# Patient Record
Sex: Female | Born: 1970 | Race: Black or African American | Hispanic: No | Marital: Married | Smoking: Never smoker
Health system: Southern US, Community
[De-identification: ages and names within clinical notes are randomized; demographics above are authoritative.]

## PROBLEM LIST (undated history)

## (undated) DIAGNOSIS — R87619 Unspecified abnormal cytological findings in specimens from cervix uteri: Secondary | ICD-10-CM

## (undated) DIAGNOSIS — Z9889 Other specified postprocedural states: Secondary | ICD-10-CM

## (undated) DIAGNOSIS — D649 Anemia, unspecified: Secondary | ICD-10-CM

## (undated) DIAGNOSIS — K219 Gastro-esophageal reflux disease without esophagitis: Secondary | ICD-10-CM

## (undated) DIAGNOSIS — F419 Anxiety disorder, unspecified: Secondary | ICD-10-CM

## (undated) DIAGNOSIS — R112 Nausea with vomiting, unspecified: Secondary | ICD-10-CM

## (undated) DIAGNOSIS — IMO0002 Reserved for concepts with insufficient information to code with codable children: Secondary | ICD-10-CM

## (undated) HISTORY — DX: Reserved for concepts with insufficient information to code with codable children: IMO0002

## (undated) HISTORY — DX: Unspecified abnormal cytological findings in specimens from cervix uteri: R87.619

## (undated) HISTORY — PX: SALIVARY GLAND SURGERY: SHX768

---

## 1998-05-18 ENCOUNTER — Other Ambulatory Visit: Admission: RE | Admit: 1998-05-18 | Discharge: 1998-05-18 | Payer: Self-pay | Admitting: Obstetrics

## 1998-05-18 ENCOUNTER — Ambulatory Visit: Admission: RE | Admit: 1998-05-18 | Discharge: 1998-05-18 | Payer: Self-pay | Admitting: Obstetrics

## 1998-09-20 ENCOUNTER — Ambulatory Visit (HOSPITAL_COMMUNITY): Admission: RE | Admit: 1998-09-20 | Discharge: 1998-09-20 | Payer: Self-pay | Admitting: Obstetrics

## 1999-01-10 ENCOUNTER — Inpatient Hospital Stay (HOSPITAL_COMMUNITY): Admission: AD | Admit: 1999-01-10 | Discharge: 1999-01-12 | Payer: Self-pay | Admitting: Obstetrics

## 2000-02-22 ENCOUNTER — Other Ambulatory Visit: Admission: RE | Admit: 2000-02-22 | Discharge: 2000-02-22 | Payer: Self-pay | Admitting: Otolaryngology

## 2000-04-03 ENCOUNTER — Encounter (INDEPENDENT_AMBULATORY_CARE_PROVIDER_SITE_OTHER): Payer: Self-pay | Admitting: *Deleted

## 2000-04-03 ENCOUNTER — Ambulatory Visit (HOSPITAL_BASED_OUTPATIENT_CLINIC_OR_DEPARTMENT_OTHER): Admission: RE | Admit: 2000-04-03 | Discharge: 2000-04-04 | Payer: Self-pay | Admitting: Otolaryngology

## 2001-06-17 ENCOUNTER — Other Ambulatory Visit: Admission: RE | Admit: 2001-06-17 | Discharge: 2001-06-17 | Payer: Self-pay | Admitting: *Deleted

## 2003-07-30 ENCOUNTER — Encounter: Payer: Self-pay | Admitting: Family Medicine

## 2003-07-30 ENCOUNTER — Encounter: Admission: RE | Admit: 2003-07-30 | Discharge: 2003-07-30 | Payer: Self-pay | Admitting: Family Medicine

## 2003-09-09 ENCOUNTER — Other Ambulatory Visit: Admission: RE | Admit: 2003-09-09 | Discharge: 2003-09-09 | Payer: Self-pay | Admitting: Family Medicine

## 2004-11-20 ENCOUNTER — Other Ambulatory Visit: Admission: RE | Admit: 2004-11-20 | Discharge: 2004-11-20 | Payer: Self-pay | Admitting: Family Medicine

## 2005-12-12 ENCOUNTER — Other Ambulatory Visit: Admission: RE | Admit: 2005-12-12 | Discharge: 2005-12-12 | Payer: Self-pay | Admitting: Obstetrics and Gynecology

## 2007-09-26 ENCOUNTER — Encounter: Admission: RE | Admit: 2007-09-26 | Discharge: 2007-09-26 | Payer: Self-pay | Admitting: Family Medicine

## 2008-07-15 ENCOUNTER — Other Ambulatory Visit: Admission: RE | Admit: 2008-07-15 | Discharge: 2008-07-15 | Payer: Self-pay | Admitting: Obstetrics and Gynecology

## 2008-07-29 ENCOUNTER — Other Ambulatory Visit: Admission: RE | Admit: 2008-07-29 | Discharge: 2008-07-29 | Payer: Self-pay | Admitting: Obstetrics and Gynecology

## 2010-06-06 ENCOUNTER — Encounter: Admission: RE | Admit: 2010-06-06 | Discharge: 2010-06-06 | Payer: Self-pay | Admitting: Obstetrics and Gynecology

## 2011-03-06 NOTE — H&P (Signed)
NAME:  Judy Bishop, FISHBURN NO.:  [id number]   MEDICAL RECORD NO.:  [id number]          PATIENT TYPE:  AMB   LOCATION:  SDC                           FACILITY:  [facility]   PHYSICIAN:  Charles A. Delcambre, MDDATE OF BIRTH:  December 30, 1970   DATE OF ADMISSION:  DATE OF DISCHARGE:                              HISTORY & PHYSICAL   This patient to be admitted to undergo laparoscopic tubal ligation and  removal of IUD for undesired fertility.  She is 40 year old gravida 3,  para 3-0-0-3 who has been counseled adequately.  She is adamantly asking  for tubal ligation as well as her husband  getting the vasectomy.  She  states her husband is to have his vasectomy on July 30, 2008.  She has  an IUD in place that she wants to have removed secondary to problems,  bleeding, and she understands risks of surgery to include infection,  bleeding, bowel, and bladder damage, ureteral damage, blood product risk  including hepatitis and HIV exposure, failure rate approximately 1 in  400 as it is with the vasectomy including the combination or failure  rate about 1 in 160,000 roughly on my calculation.  She underwent  colposcopy today for low-grade Pap smear, unknown HPV.  HPV testing was  done.  At colposcopy, 2 biopsies were done with nonstained Lugol, and we  will get the biopsies back 1 or 2 days before the surgery, which will  indicate whether we will need to proceed with expected management.  Repeat Pap in 6 months or LEEP therapy while she is under anesthesia.   PAST MEDICAL HISTORY.:  None.   SURGICAL HISTORY:  Cesarean section for breech, SVT x2, thereafter.   MEDICATIONS:  None.   ALLERGIES:  No known drug allergies.   SOCIAL HISTORY:  No tobacco, ethanol, or drug use or STD exposure in the  past that she is aware of.  She is married in monogamous relationship  with her husband.   FAMILY HISTORY:  Denies family history of breast, uterus, ovaries  cervix, colon cancer, lymphoma,  coronary artery disease, stroke,  diabetes, or hypertension.   REVIEW OF SYSTEMS:  Denies fever, chills, nausea, vomiting, diarrhea,  constipation, bowel, bladder changes, headache, dizziness, chest pain,  shortness of breath, wheezing, urgency, frequency, dysuria,  galactorrhea, or emotional changes.   PHYSICAL EXAMINATION:  GENERAL:  Alert and oriented.  VITAL SIGNS:  Blood pressure 110/74, height 5 feet 4 inches, weight 164  pounds, respirations 18, and pulse 76.  HEENT:  Grossly within normal limits.  HEART:  Regular rate and rhythm without murmur, rub, or gallop.  LUNGS:  Clear bilaterally.  ABDOMEN:  Soft, nontender.  No hepatosplenomegaly.  PELVIC:  Normal external female genitalia.  Bartholin, urethra, and  Skene glands are normal.  Vulva without discharge or lesions.  Multiparous cervix seen.  IUD strings are seen.  BIMANUAL:  Uterus not enlarged.  Ovaries are palpably normal size  bilaterally.   ASSESSMENT:  1. Undesired fertility.  2. Undergoing workup for low grade Pap smear.  3. Intrauterine device removed, desire removal.  PLAN:  Laparoscopic bilateral tubal ligation, planned.  Fallope rings  and removal of the IUD after successful sterilization.  Preoperative CBC  and serum hCG.  All questions were answered and she gave informed  consent.  We will proceed as outlined.  N.p.o. past midnightthe day  before surgery.      Charles A. Sydnee Cabal, MD  Electronically Signed     CAD/MEDQ  D:  07/29/2008  T:  07/30/2008  Job:  [id number]

## 2011-03-09 NOTE — Op Note (Signed)
[place]  Patient:    Judy Bishop, Judy Bishop                           MRN: [id number] Proc. Date: 04/03/00 Adm. Date:  [id number] Disc. Date: [id number] Attending:  Susy Frizzle CC:         Gretta Arab. Valentina Lucks, M.D.                           Operative Report  PREOPERATIVE DIAGNOSIS:  Submandibular mass.  POSTOPERATIVE DIAGNOSIS:  Submandibular mass.  OPERATION:  Submandibular triangle dissection.  REFERRING PHYSICIAN:  Gretta Arab. Valentina Lucks, M.D.  SURGEON:  Jefry H. Pollyann Kennedy, M.D.  ASSISTANT:  Kathy Breach, M.D.  ANESTHESIA:  General endotracheal anesthesia.  COMPLICATIONS:  None.  FINDINGS:  A 2 cm separate mass superior to the submandibular gland in the submandibular triangle medial to the ramus of the mandible.  The specimen was sent for pathologic evaluation and labeled submandibular triangle content.  Additional findings were two enlarged lymph nodes in the submandibular triangle-one preglandular and one prevascular lymph node approximately 1 cm in diameter.  The patient tolerated the procedure well.  She was awakened, extubated, and transferred to the recovery room in stable condition.  INDICATIONS:  A 40 year old with a several month history of a firm mass in the submandibular area.  Fine needle aspiration biopsy was suspicious for lymphoproliferative process.  Risks, benefits, alternatives, complications of the procedure were explained, who seemed to understand, and agreed to the surgery.  DESCRIPTION OF PROCEDURE:  The patient was taken to the operating room and placed on the operating table in the supine position.  Following the induction of general endotracheal anesthesia, the table was turned 90 degrees.  A shoulder roll was placed and the neck was prepped and draped in a standard fashion.  A transverse incision in the upper cervical crease was outlined about two fingerbreadths below the angle of the mandible and was incised  with electrocautery.  The platysma was divided and subplatysmal flap was developed superiorly up to the ramus of the mandible.  The marginal mandibular branch of the facial nerve was identified and retracted superiorly.  The facial nodes were brought inferiorly with the specimen.  The facial vessels were divided with clamps and ligated with 4-0 silk sutures.  The contents of the triangle were brought down off of the anterior belly of the digastric muscle and the mylohyoid muscle.  The mylohyoid was reflected anteriorly and the lingual nerve was identified and preserved.  The ganglion was taken.  The lingual extension of the submandibular gland and the ducts were ligated between clamps and divided.  The hypoglossal nerve was identified and preserved.  The posterior fascial vessels were identified and ligated between clamps with silk sutures.  The mouth was brought down in continuity with the gland, surrounding lymph nodes, and surrounding fiber fatty tissue.  Specimen was brought down off of the digastric and was delivered from the wound and sent fresh for pathologic evaluation and lymphoma workup.  The wound was irrigated with saline and electrocautery and silk sutures were used as needed for hemostasis.  The wound was closed in layers using 3-0 chromic on the platysma layer and a running 5-0 nylon on the skin.  A 7 [place] JP drain was left in the wound, exited through the posterior aspect of the incision, secured in place with nylon suture, and the  patient was awakened, extubated, and transferred to the recovery. DD:  04/03/00 TD:  04/06/00 Job: [id number]

## 2013-08-24 ENCOUNTER — Telehealth: Payer: Self-pay | Admitting: Obstetrics & Gynecology

## 2013-08-24 NOTE — Telephone Encounter (Signed)
Message left to return call to [place] at [phone] at both home and mobile numbers.

## 2013-08-24 NOTE — Telephone Encounter (Signed)
Patient started spotting October 6th and she thinkgs she had her cycle on October 17 to the 21 because the bleeding was heavier but she has continued spotting. Wears panty liner for the spotting.

## 2013-08-24 NOTE — Telephone Encounter (Signed)
Called again before close of office and no answer on cell phone.

## 2013-08-25 NOTE — Telephone Encounter (Signed)
Patient returning Tracy's call. °

## 2013-08-25 NOTE — Telephone Encounter (Signed)
Spoke with patient.  Needs AEX which is scheduled for 08/28/13  Judy Bishop is a 42 y.o. female  Has not been seen in office since 03/07/11 for 6 month follow up pap with Dr. Hyacinth Meeker.  Not on any birth control, husband has had vasectomy. Previous Menses were regular, usually lasting 5 days. Since 10/1 has had spotting which she has only required the use of a panty liner, with a cycle from 17-21, which was normal per patient and then had spotting return. Denies abdominal pain. No change in medications.   She wanted to know if she should be concerned. Advised that this irregular bleeding may be a sign of perimenopause.  Advised can discuss at AEX which I have moved up to 11/7.   Routing to provider for final review. Patient agreeable to disposition. Will close encounter

## 2013-08-27 ENCOUNTER — Encounter: Payer: Self-pay | Admitting: Obstetrics & Gynecology

## 2013-08-28 ENCOUNTER — Ambulatory Visit (INDEPENDENT_AMBULATORY_CARE_PROVIDER_SITE_OTHER): Payer: PRIVATE HEALTH INSURANCE | Admitting: Obstetrics & Gynecology

## 2013-08-28 ENCOUNTER — Encounter: Payer: Self-pay | Admitting: Obstetrics & Gynecology

## 2013-08-28 VITALS — BP 116/70 | HR 64 | Resp 16 | Ht 64.75 in | Wt 161.2 lb

## 2013-08-28 DIAGNOSIS — Z0000 Encounter for general adult medical examination without abnormal findings: Secondary | ICD-10-CM

## 2013-08-28 DIAGNOSIS — Z01419 Encounter for gynecological examination (general) (routine) without abnormal findings: Secondary | ICD-10-CM

## 2013-08-28 DIAGNOSIS — R319 Hematuria, unspecified: Secondary | ICD-10-CM

## 2013-08-28 DIAGNOSIS — N852 Hypertrophy of uterus: Secondary | ICD-10-CM

## 2013-08-28 DIAGNOSIS — Z124 Encounter for screening for malignant neoplasm of cervix: Secondary | ICD-10-CM

## 2013-08-28 LAB — COMPREHENSIVE METABOLIC PANEL
ALT: 10 U/L (ref 0–35)
AST: 17 U/L (ref 0–37)
Alkaline Phosphatase: 49 U/L (ref 39–117)
CO2: 26 mEq/L (ref 19–32)
Creat: 0.85 mg/dL (ref 0.50–1.10)
Potassium: 3.8 mEq/L (ref 3.5–5.3)
Sodium: 138 mEq/L (ref 135–145)
Total Bilirubin: 0.7 mg/dL (ref 0.3–1.2)
Total Protein: 6.8 g/dL (ref 6.0–8.3)

## 2013-08-28 LAB — POCT URINALYSIS DIPSTICK
Bilirubin, UA: NEGATIVE
Glucose, UA: NEGATIVE
Ketones, UA: NEGATIVE
Nitrite, UA: NEGATIVE
Protein, UA: NEGATIVE
Urobilinogen, UA: NEGATIVE
pH, UA: 5

## 2013-08-28 LAB — LIPID PANEL
HDL: 78 mg/dL (ref >39)
LDL Cholesterol: 150 mg/dL — ABNORMAL HIGH (ref 0–99)
Triglycerides: 57 mg/dL (ref <150)
VLDL: 11 mg/dL (ref 0–40)

## 2013-08-28 LAB — HEMOGLOBIN, FINGERSTICK: Hemoglobin, fingerstick: 11 g/dL — ABNORMAL LOW (ref 12.0–16.0)

## 2013-08-28 NOTE — Progress Notes (Signed)
42 y.o. G3P3 MarriedAfrican AmericanF here for annual exam.  Has been 2 1/2 years since she's been here.  PMed Hx, PSurgHx, Fam Hx, SH all updated.  Cycles have been regular.  Bleeding is normally four heavy days.  October was abnormal with spotting mid cycle and then real flow for a couple of days.  Bleeding ended 10/26.  Still spotting now.    Patient's last menstrual period was 08/08/2013.          Sexually active: yes  The current method of family planning is vasectomy.    Exercising: yes  recumbant bike Smoker:  no  Health Maintenance: Pap:  03/07/11 WNL History of abnormal Pap:  yes MMG:  06/06/10 diag and US-screening age 21 Colonoscopy:  none BMD:   none TDaP:  Up to date Screening Labs: today, Hb today: 11.0, Urine today: tr RBC, tr WBC   reports that she has never smoked. She has never used smokeless tobacco. She reports that she does not drink alcohol or use illicit drugs.  Past Medical History  Diagnosis Date  . Abnormal Pap smear     Past Surgical History  Procedure Laterality Date  . Cesarean section  1993  . Salivary gland surgery      lump under chin removed    Current Outpatient Prescriptions  Medication Sig Dispense Refill  . IRON PO Take by mouth as needed.      . Multiple Vitamins-Minerals (MULTIVITAMIN PO) Take by mouth daily.       No current facility-administered medications for this visit.    Family History  Problem Relation Age of Onset  . Diabetes Father   . Alzheimer's disease Paternal Grandmother     ROS:  Pertinent items are noted in HPI.  Otherwise, a comprehensive ROS was negative.  Exam:   BP 116/70  Pulse 64  Resp 16  Ht 5' 4.75" (1.645 m)  Wt 161 lb 3.2 oz (73.12 kg)  BMI 27.02 kg/m2  LMP 08/08/2013  Weight change: stable   Height: 5' 4.75" (164.5 cm)  Ht Readings from Last 3 Encounters:  08/28/13 5' 4.75" (1.645 m)    General appearance: alert, cooperative and appears stated age Head: Normocephalic, without obvious  abnormality, atraumatic Neck: no adenopathy, supple, symmetrical, trachea midline and thyroid normal to inspection and palpation Lungs: clear to auscultation bilaterally Breasts: normal appearance, no masses or tenderness Heart: regular rate and rhythm Abdomen: soft, non-tender; bowel sounds normal; no masses,  no organomegaly Extremities: extremities normal, atraumatic, no cyanosis or edema Skin: Skin color, texture, turgor normal. No rashes or lesions Lymph nodes: Cervical, supraclavicular, and axillary nodes normal. No abnormal inguinal nodes palpated Neurologic: Grossly normal   Pelvic: External genitalia:  no lesions              Urethra:  normal appearing urethra with no masses, tenderness or lesions              Bartholins and Skenes: normal                 Vagina: normal appearing vagina with normal color and discharge, no lesions              Cervix: no lesions              Pap taken: no Bimanual Exam:  Uterus:  enlarged, 8 weeks and globular, larger on pt's right weeks size and mobile              Adnexa: normal  adnexa and no mass, fullness, tenderness               Rectovaginal: Confirms               Anus:  normal sphincter tone, no lesions  A:  Well Woman with normal exam H/O CIN 1 Lapse of care Menorrhagia Enlarged uterus c/w menorrhagia Anemia, newly on iron  P:   Mammogram yearly CMP, lipids, TSH, Vit D PUS Urine culture pap smear with HR HPV return annually or prn  An After Visit Summary was printed and given to the patient.

## 2013-08-28 NOTE — Patient Instructions (Signed)

## 2013-09-01 LAB — IPS PAP TEST WITH HPV

## 2013-09-02 ENCOUNTER — Other Ambulatory Visit: Payer: Self-pay | Admitting: Family Medicine

## 2013-09-02 DIAGNOSIS — D219 Benign neoplasm of connective and other soft tissue, unspecified: Secondary | ICD-10-CM

## 2013-09-03 ENCOUNTER — Telehealth: Payer: Self-pay

## 2013-09-03 ENCOUNTER — Other Ambulatory Visit: Payer: Self-pay

## 2013-09-03 NOTE — Telephone Encounter (Signed)
lmtcb

## 2013-09-03 NOTE — Telephone Encounter (Signed)
Message copied by Elisha Headland on Thu Sep 03, 2013  2:50 PM ------      Message from: Jerene Bears      Created: Mon Aug 31, 2013  8:41 AM       Inform urine culture neg.  Also inform cholesterol is a little elevated but not bad.  HDLs, TGs, and ratio ok.  She needs AEX scheduled.  I will check IUD strings then. ------

## 2013-09-08 ENCOUNTER — Ambulatory Visit
Admission: RE | Admit: 2013-09-08 | Discharge: 2013-09-08 | Disposition: A | Discharge status: Home / Self Care | Payer: PRIVATE HEALTH INSURANCE | Source: Ambulatory Visit | Attending: Family Medicine | Admitting: Family Medicine

## 2013-09-08 ENCOUNTER — Telehealth: Payer: Self-pay | Admitting: Obstetrics & Gynecology

## 2013-09-08 DIAGNOSIS — D219 Benign neoplasm of connective and other soft tissue, unspecified: Secondary | ICD-10-CM

## 2013-09-08 NOTE — Telephone Encounter (Signed)
Ultrasound result in EPIC has Mady Gemma [facility] as ordering provider. Not sure if results went to your inbox.  Since you have not viewed it, called patient, LM on VM (number confirmed on VM msg) that no results available yet but we will call her. We will be available after 0830 tomm.

## 2013-09-08 NOTE — Telephone Encounter (Signed)
Patient aware of all results. 

## 2013-09-08 NOTE — Telephone Encounter (Signed)
U/S showed at least six fibroids, largest was almost 5cm.  Pt is having worsening bleeding.  Options for treatment--OCPs, IUD, ablation, myomectomy, hysterectomy.  Pt prob needs consult to discuss.

## 2013-09-08 NOTE — Telephone Encounter (Signed)
Pt calling for results from ultrasound done at [place] [facility] today.

## 2013-09-09 NOTE — Telephone Encounter (Signed)
Notified patient of results and limited overview of the options Dr Hyacinth Meeker has listed.  She will consider this info and do some research and then call back for consult with Dr Hyacinth Meeker.

## 2013-09-11 ENCOUNTER — Telehealth: Payer: Self-pay | Admitting: Obstetrics & Gynecology

## 2013-09-11 NOTE — Telephone Encounter (Signed)
Patient calling to schedule a surgery consultation please?

## 2013-09-14 NOTE — Telephone Encounter (Signed)
LMTCB on cell number, no answer on home number.

## 2013-09-16 NOTE — Telephone Encounter (Signed)
Patient calling to schedule consult to discuss options for surgery.  Patient with limited availability and offered several appointments that patient declined due to work schedule. Scheduled for 11-06-13 and if her schedule changes, she will call for cancellation.  Again, declined earlier appt. Only off on 10-06-13 and 10-14-13 when Dr Hyacinth Meeker out of office.  Routing to provider for final review. Patient agreeable to disposition. Will close encounter

## 2013-10-05 ENCOUNTER — Ambulatory Visit: Payer: Self-pay | Admitting: Obstetrics & Gynecology

## 2013-10-27 ENCOUNTER — Encounter: Payer: Self-pay | Admitting: Obstetrics & Gynecology

## 2013-10-27 ENCOUNTER — Ambulatory Visit (INDEPENDENT_AMBULATORY_CARE_PROVIDER_SITE_OTHER): Payer: PRIVATE HEALTH INSURANCE | Admitting: Obstetrics & Gynecology

## 2013-10-27 VITALS — BP 122/62 | HR 78 | Resp 18 | Wt 163.0 lb

## 2013-10-27 DIAGNOSIS — D259 Leiomyoma of uterus, unspecified: Secondary | ICD-10-CM

## 2013-10-27 DIAGNOSIS — D219 Benign neoplasm of connective and other soft tissue, unspecified: Secondary | ICD-10-CM

## 2013-10-27 DIAGNOSIS — N920 Excessive and frequent menstruation with regular cycle: Secondary | ICD-10-CM

## 2013-11-03 ENCOUNTER — Telehealth: Payer: Self-pay | Admitting: Obstetrics & Gynecology

## 2013-11-03 NOTE — Telephone Encounter (Signed)
Pt states she is returning a call to [place] .There is no note or info.

## 2013-11-04 NOTE — Telephone Encounter (Signed)
Robotic TLH/bilateral salpingectomy.  Needs to be precerted too.

## 2013-11-04 NOTE — Telephone Encounter (Signed)
Return call to patient to patient to check on date preferences.  She is interested in February 9 but needs to be able to travel to [place] on 12-05-13 for event for son at [place]. She would not be driving but it would be important for her not to miss the event.  I advised that I thought this would be too soon after surgery on 2-9 but will check with MD to see how soon she could travel.  If 2-9 not recommended, would 2-2 be ok?  Patient also checking on OOP cost, advised will have insurance dept check on this and call her.

## 2013-11-04 NOTE — Telephone Encounter (Signed)
Dr Sabra Heck, What type of procedure does she need scheduled?

## 2013-11-05 NOTE — Telephone Encounter (Signed)
Patient returned call. I advised that per the benefit quote received, she will owe $1702.85 for her surgery. Patient was advised that this amount would need to be paid in full 2 weeks prior to surgery. Patient asked if she could be set up on a payment plan, I gave this example "if surgery is schedule for Feb 16, you can make payments up until Feb 02, but the payment must be made in full by Feb 02". Patient agreeable and is waiting for Gay Filler to call and schedule surgery.

## 2013-11-05 NOTE — Telephone Encounter (Signed)
Patient returning a call to [employer]

## 2013-11-05 NOTE — Telephone Encounter (Signed)
Telephone patient/ voicemail confirmed mobile #/ left message for patient to call back to review insurance information for surgery//ssf

## 2013-11-08 DIAGNOSIS — D219 Benign neoplasm of connective and other soft tissue, unspecified: Secondary | ICD-10-CM | POA: Insufficient documentation

## 2013-11-08 DIAGNOSIS — N920 Excessive and frequent menstruation with regular cycle: Secondary | ICD-10-CM | POA: Insufficient documentation

## 2013-11-08 NOTE — Progress Notes (Signed)
43 y.o. Married [place] American female G3P3 here for discussion of treatment options for her fibroids.  Pt seen by me on 08/28/13.  Although pt is a long-term pt of out office, this was my first exam of year in several years.  She described a change in cycles including heavier and longer cycles with increased clotting.  Her physical was significant for 8-10 week sized uterus that was larger on her right but still mobile.  Ultrasound was recommended.  This was done  09/08/13 showing 11.4 x 5.2 x 8.8cm uterus with multiple fibroids including three fundal fibroids--4.7cm, 3.5cm and three additional 2.0cm fibroids.  She is here to discuss options.  OCPs, Mirena IUD, endometrial ablation, myomectomy, uterine artery embolization and hysterectomy all discussed in depth.  Procedures, risks, recovery, and benefits for each one discussed.  Pt is not interested in hormonal therapy and really wants to be done with this.  She does not want to have any future bleeding again.  She is completely done with childbearing and is sure of that decision, as is her spouse.    Patient would like to proceed with scheduling a hysterectomy.  She will need to schedule after her current work schedule is complete.  This is done monthly.  I did confirm with her that she wants it done here and not at [facility], where she works.  She wants it done here.  Patient has questions about morcellation, although she is not sure the name.  Although, I may need to vaginally morcellate the uterus for removal, no power morcellator will be used.  This is where the concern for cancer spread is.  She is aware that there is about a 1:250 change for hidden cancer but I feel this is lower in her due to age and number of fibroids.    Finally, ovarian preservation discussed.  I highly recommend patient keep her ovaries but have her tubes removed.  Relation to ovarian cancer discussed.    Pt voiced clear understanding of all we discussed.    O: Healthy WD,WN  female Affect: normal  A: Symptomatic uterine fibroids  Plan:  Robotic assisted TLH/bilateral salpingectomy with ovarian preservation planned.  Will pre-cert and proceed with scheduling.    ~Lenghty 60 minutes spent with patient >50% of time was in face to face discussion of above.

## 2013-11-09 NOTE — Telephone Encounter (Signed)
Call to patient, VM has number confirmation, LM surgery scheduled for 12-07-13 and call back for additional info.  Dr Sabra Heck does not want her to travel the week of surgery so procedure scheduled for 12-07-13. LMTCB.

## 2013-11-09 NOTE — Telephone Encounter (Signed)
Anything else need to be done?

## 2013-11-10 NOTE — Telephone Encounter (Signed)
Call to patient with surgery date info. LMTCB.

## 2013-11-11 NOTE — Telephone Encounter (Signed)
Message left to return call to Judy Bishop at [phone].    

## 2013-11-11 NOTE — Telephone Encounter (Signed)
Patient returning Sally's call. °

## 2013-11-11 NOTE — Telephone Encounter (Signed)
Spoke with patient. She states she is aware of surgery date. Also, she has FMLA forms to complete. Advised there is a 25.00 fee for completion of forms. She will have husband drop forms off.   Advised that Gay Filler or another nurse would call her back with surgery instructions and dates for pre-op and follow up appointments. Patient agreeable of plan.

## 2013-11-20 ENCOUNTER — Telehealth: Payer: Self-pay | Admitting: Emergency Medicine

## 2013-11-20 ENCOUNTER — Telehealth: Payer: Self-pay | Admitting: Obstetrics & Gynecology

## 2013-11-20 ENCOUNTER — Encounter: Payer: Self-pay | Admitting: Obstetrics & Gynecology

## 2013-11-20 NOTE — Telephone Encounter (Signed)
Call to patient to review surgery instructions.  VM on cell number is full, LM on home VM.

## 2013-11-20 NOTE — Telephone Encounter (Signed)
Pt came in and paid $25 FMLA Form Fee Chart is outside your door.

## 2013-11-20 NOTE — Telephone Encounter (Signed)
Patient calling and states she is filling out form for her insurance that needs to be completed by her (not for physician) for [place].  Advised of the following:  Requesting Dx: Symptomatic uterine fibroids Status: Active Consult appointment done: Yes, on 10/27/13 with Dr. Sabra Heck.  Initial evaluation: I advised 08/28/13 per Dr. Sanjuan Dame notes, but that she has been seen here for prior that further workup has been done since 08/28/13.   Patient also requested that rx medications that she would need post surgery be done earlier. I advised that rx were given at time of discharge from hospital and that there are variables to what medications she may need. Advised would double check with Dr. Sabra Heck though, and call her back if Dr. Sabra Heck wanted to do anything different. Patient agreeable.   Routing to provider for final review. Patient agreeable to disposition. Will close encounter

## 2013-11-23 NOTE — Telephone Encounter (Signed)
Judy Bishop contacted patient and reviewed surgery instructions.  Routing to provider for final review. Patient agreeable to disposition. Will close encounter

## 2013-11-24 ENCOUNTER — Ambulatory Visit: Payer: PRIVATE HEALTH INSURANCE | Admitting: Obstetrics & Gynecology

## 2013-11-25 ENCOUNTER — Telehealth: Payer: Self-pay | Admitting: Obstetrics & Gynecology

## 2013-11-25 NOTE — Telephone Encounter (Signed)
Return call to patient, advised I do not recommend delaying preop later into next week since surgery is scheduled for 12-07-13.  Only available option is 830 in am and to arrive at 815.  Patient agreeable.  Routing to provider for final review. Patient agreeable to disposition. Will close encounter

## 2013-11-25 NOTE — Telephone Encounter (Signed)
Patient has preop appt scheduled for 11/30/13 wants to move it to 11/26/13 or 11/27/13 if not those days then later next week. Can you find something for her?

## 2013-11-26 ENCOUNTER — Ambulatory Visit (INDEPENDENT_AMBULATORY_CARE_PROVIDER_SITE_OTHER): Payer: PRIVATE HEALTH INSURANCE | Admitting: Obstetrics & Gynecology

## 2013-11-26 ENCOUNTER — Encounter (HOSPITAL_COMMUNITY): Payer: Self-pay

## 2013-11-26 VITALS — BP 116/62 | HR 72 | Resp 16 | Ht 65.0 in | Wt 160.6 lb

## 2013-11-26 DIAGNOSIS — N9489 Other specified conditions associated with female genital organs and menstrual cycle: Secondary | ICD-10-CM

## 2013-11-26 DIAGNOSIS — N920 Excessive and frequent menstruation with regular cycle: Secondary | ICD-10-CM

## 2013-11-26 DIAGNOSIS — D219 Benign neoplasm of connective and other soft tissue, unspecified: Secondary | ICD-10-CM

## 2013-11-26 DIAGNOSIS — R102 Pelvic and perineal pain: Secondary | ICD-10-CM

## 2013-11-26 DIAGNOSIS — D259 Leiomyoma of uterus, unspecified: Secondary | ICD-10-CM

## 2013-11-26 MED ORDER — OXYCODONE-ACETAMINOPHEN 5-325 MG PO TABS: 2.0000 | ORAL_TABLET | ORAL | Status: DC | PRN

## 2013-11-26 MED ORDER — IBUPROFEN 800 MG PO TABS: 800.0000 mg | ORAL_TABLET | Freq: Three times a day (TID) | ORAL | Status: DC | PRN

## 2013-11-26 MED ORDER — ALPRAZOLAM 0.5 MG PO TABS: 0.5000 mg | ORAL_TABLET | Freq: Every evening | ORAL | Status: DC | PRN

## 2013-11-26 NOTE — Progress Notes (Signed)
43 y.o. [place] female here for discussion of upcoming procedure.  Robotic TLH planned due to symptomatic fibroids including menorrhagia, pelvic pressure and pain.  Pre-op evaluation thus far has included PUS done at [facility] in November.  Uterus measured 11.4 x 5.2 x 8.8cm with at least six fibroids--largest 5cm, then two that are 3cm and then another 2.5cm, 2cm, and 2cm fibroid.  Pap 11/14 was neg with neg HR HPV.  Procedure discussed with patient.  Hospital stay, recovery and pain management all discussed.  Risks discussed including but not limited to bleeding, 1% risk of receiving a  transfusion, infection, 3-4% risk of bowel/bladder/ureteral/vascular injury discussed as well as possible need for additional surgery if injury does occur discussed.  DVT/PE and rare risk of death discussed.  My actual complications with prior surgeries discussed.  Vaginal cuff dehiscence discussed.  Hernia formation discussed.  Positioning and incision locations discussed.  Patient aware if pathology abnormal she may need additional treatment.  All questions answered.    Her biggest concern is post operative nausea.  She will definitely discuss this at pre-op appt.  Ob Hx:   Patient's last menstrual period was 11/10/2013.          Sexually active: yes Birth control: vasectomy Last pap: 08/28/13 Last MMG: 10/06/13--with normal follow-up.  (not in EPIC but pt reports normal f/u) Tobacco: non smoker  Past Surgical History  Procedure Laterality Date  . Cesarean section  1993  . Salivary gland surgery      lump under chin removed    Past Medical History  Diagnosis Date  . Abnormal Pap smear     Allergies: Review of patient's allergies indicates no known allergies.  Current Outpatient Prescriptions  Medication Sig Dispense Refill  . IRON PO Take by mouth as needed.      . Multiple Vitamins-Minerals (MULTIVITAMIN PO) Take by mouth daily.       No current facility-administered medications for  this visit.    ROS: A comprehensive review of systems was negative.  Exam:    BP 116/62  Pulse 72  Resp 16  Ht 5\' 5"  (1.651 m)  Wt 160 lb 9.6 oz (72.848 kg)  BMI 26.73 kg/m2  LMP 11/10/2013  General appearance: alert and cooperative Head: Normocephalic, without obvious abnormality, atraumatic Neck: no adenopathy, supple, symmetrical, trachea midline and thyroid not enlarged, symmetric, no tenderness/mass/nodules Lungs: clear to auscultation bilaterally Heart: regular rate and rhythm, S1, S2 normal, no murmur, click, rub or gallop Abdomen: soft, non-tender; bowel sounds normal; no masses,  no organomegaly Extremities: extremities normal, atraumatic, no cyanosis or edema Skin: Skin color, texture, turgor normal. No rashes or lesions Lymph nodes: Cervical, supraclavicular, and axillary nodes normal. no inguinal nodes palpated Neurologic: Grossly normal  Pelvic: External genitalia:  no lesions              Urethra: normal appearing urethra with no masses, tenderness or lesions              Bartholins and Skenes: Bartholin's, Urethra, Skene's normal                 Vagina: normal appearing vagina with normal color and discharge, no lesions              Cervix: normal appearance              Pap taken: no        Bimanual Exam:  Uterus:  enlarged to 12 week's size, mobile  Adnexa:    normal adnexa in size, nontender and no masses                                      Rectovaginal: Deferred                                      Anus:  defer exam  A: Uterine Fibroids, Symptomatic     P:  Robotic TLH/bilateral salpingectomy with possible BSO planned Rx for Motrin and Percocet given. Surgical orders placed. Medications/Vitamins reviewed. Hysterectomy brochure given for pre and post op instructions.  Patient would like a picture!

## 2013-11-30 ENCOUNTER — Institutional Professional Consult (permissible substitution): Payer: PRIVATE HEALTH INSURANCE | Admitting: Obstetrics & Gynecology

## 2013-12-01 ENCOUNTER — Telehealth: Payer: Self-pay | Admitting: Obstetrics & Gynecology

## 2013-12-01 NOTE — Telephone Encounter (Signed)
Patient is asking if her FMLA forms have been faxed to her work. Patient is aware Gay Filler is out of the office until 12/03/13.

## 2013-12-03 NOTE — Telephone Encounter (Signed)
FMLA forms completed and faxed to [employer] at [phone] as instructed by patient. Call to patient, LM that this has been completed.

## 2013-12-04 ENCOUNTER — Encounter (HOSPITAL_COMMUNITY): Payer: Self-pay

## 2013-12-04 ENCOUNTER — Encounter (HOSPITAL_COMMUNITY)
Admission: RE | Admit: 2013-12-04 | Discharge: 2013-12-04 | Disposition: A | Discharge status: Home / Self Care | Payer: PRIVATE HEALTH INSURANCE | Source: Ambulatory Visit | Attending: Obstetrics & Gynecology | Admitting: Obstetrics & Gynecology

## 2013-12-04 HISTORY — DX: Other specified postprocedural states: Z98.890

## 2013-12-04 HISTORY — DX: Gastro-esophageal reflux disease without esophagitis: K21.9

## 2013-12-04 HISTORY — DX: Nausea with vomiting, unspecified: R11.2

## 2013-12-04 HISTORY — DX: Anemia, unspecified: D64.9

## 2013-12-04 HISTORY — DX: Anxiety disorder, unspecified: F41.9

## 2013-12-04 LAB — CBC
HCT: 29.6 % — ABNORMAL LOW (ref 36.0–46.0)
Hemoglobin: 9.5 g/dL — ABNORMAL LOW (ref 12.0–15.0)
MCH: 26.6 pg (ref 26.0–34.0)
MCHC: 32.1 g/dL (ref 30.0–36.0)
MCV: 82.9 fL (ref 78.0–100.0)
PLATELETS: 236 10*3/uL (ref 150–400)
RBC: 3.57 MIL/uL — ABNORMAL LOW (ref 3.87–5.11)
RDW: 14.5 % (ref 11.5–15.5)
WBC: 5.5 10*3/uL (ref 4.0–10.5)

## 2013-12-04 LAB — TYPE AND SCREEN
ABO/RH(D): O POS
Antibody Screen: NEGATIVE

## 2013-12-04 LAB — ABO/RH: ABO/RH(D): O POS

## 2013-12-04 NOTE — Patient Instructions (Signed)
Your procedure is scheduled on: Monday, Feb. 16, 2015  Enter through the [employer] of [facility] at: 6:00am  Pick up the phone at the desk and dial [phone].  Call this number if you have problems the morning of surgery: [phone].  Remember: Do NOT eat food: AFTER MIDNIGHT SUNDAY Do NOT drink clear liquids after: AFTER MIDNIGHT SUNDAY Take these medicines the morning of surgery with a SIP OF WATER: XANAX IF NEEDED  Do NOT wear jewelry (body piercing), make-up, or nail polish. Do NOT wear lotions, powders, or perfumes.  You may wear deoderant. Do NOT shave for 48 hours prior to surgery. Do NOT bring valuables to the hospital. Contacts, dentures, or bridgework may not be worn into surgery. Leave suitcase in car.  After surgery it may be brought to your room.  For patients admitted to the hospital, checkout time is 11:00 AM the day of discharge.

## 2013-12-06 MED ORDER — CEFOTETAN DISODIUM 2 G IJ SOLR
2.0000 g | INTRAMUSCULAR | Status: AC
  Administered 2013-12-07: 2 g via INTRAVENOUS
  Filled 2013-12-06: qty 2

## 2013-12-07 ENCOUNTER — Encounter (HOSPITAL_COMMUNITY): Payer: PRIVATE HEALTH INSURANCE | Admitting: Anesthesiology

## 2013-12-07 ENCOUNTER — Encounter (HOSPITAL_COMMUNITY): Payer: Self-pay | Admitting: Certified Registered"

## 2013-12-07 ENCOUNTER — Ambulatory Visit (HOSPITAL_COMMUNITY): Payer: PRIVATE HEALTH INSURANCE | Admitting: Anesthesiology

## 2013-12-07 ENCOUNTER — Encounter (HOSPITAL_COMMUNITY)
Admission: RE | Disposition: A | Discharge status: Home / Self Care | Payer: Self-pay | Source: Ambulatory Visit | Attending: Obstetrics & Gynecology

## 2013-12-07 ENCOUNTER — Encounter: Payer: Self-pay | Admitting: Obstetrics & Gynecology

## 2013-12-07 ENCOUNTER — Ambulatory Visit (HOSPITAL_COMMUNITY)
Admission: RE | Admit: 2013-12-07 | Discharge: 2013-12-08 | Disposition: A | Discharge status: Home / Self Care | Payer: PRIVATE HEALTH INSURANCE | Source: Ambulatory Visit | Attending: Obstetrics & Gynecology | Admitting: Obstetrics & Gynecology

## 2013-12-07 DIAGNOSIS — D259 Leiomyoma of uterus, unspecified: Secondary | ICD-10-CM

## 2013-12-07 DIAGNOSIS — K219 Gastro-esophageal reflux disease without esophagitis: Secondary | ICD-10-CM | POA: Insufficient documentation

## 2013-12-07 DIAGNOSIS — D219 Benign neoplasm of connective and other soft tissue, unspecified: Secondary | ICD-10-CM | POA: Diagnosis present

## 2013-12-07 DIAGNOSIS — N920 Excessive and frequent menstruation with regular cycle: Secondary | ICD-10-CM | POA: Insufficient documentation

## 2013-12-07 DIAGNOSIS — N852 Hypertrophy of uterus: Secondary | ICD-10-CM

## 2013-12-07 DIAGNOSIS — N838 Other noninflammatory disorders of ovary, fallopian tube and broad ligament: Secondary | ICD-10-CM | POA: Insufficient documentation

## 2013-12-07 DIAGNOSIS — D251 Intramural leiomyoma of uterus: Secondary | ICD-10-CM | POA: Insufficient documentation

## 2013-12-07 DIAGNOSIS — N870 Mild cervical dysplasia: Secondary | ICD-10-CM | POA: Insufficient documentation

## 2013-12-07 DIAGNOSIS — D252 Subserosal leiomyoma of uterus: Secondary | ICD-10-CM | POA: Insufficient documentation

## 2013-12-07 DIAGNOSIS — R102 Pelvic and perineal pain: Secondary | ICD-10-CM | POA: Insufficient documentation

## 2013-12-07 DIAGNOSIS — D649 Anemia, unspecified: Secondary | ICD-10-CM | POA: Insufficient documentation

## 2013-12-07 DIAGNOSIS — D500 Iron deficiency anemia secondary to blood loss (chronic): Secondary | ICD-10-CM

## 2013-12-07 HISTORY — PX: ROBOTIC ASSISTED TOTAL HYSTERECTOMY: SHX6085

## 2013-12-07 HISTORY — PX: CYSTOSCOPY: SHX5120

## 2013-12-07 LAB — CBC
HCT: 30.2 % — ABNORMAL LOW (ref 36.0–46.0)
Hemoglobin: 9.6 g/dL — ABNORMAL LOW (ref 12.0–15.0)
MCH: 26.3 pg (ref 26.0–34.0)
MCHC: 31.8 g/dL (ref 30.0–36.0)
MCV: 82.7 fL (ref 78.0–100.0)
PLATELETS: 216 10*3/uL (ref 150–400)
RBC: 3.65 MIL/uL — ABNORMAL LOW (ref 3.87–5.11)
RDW: 14.3 % (ref 11.5–15.5)
WBC: 9.8 10*3/uL (ref 4.0–10.5)

## 2013-12-07 LAB — PREGNANCY, URINE: PREG TEST UR: NEGATIVE

## 2013-12-07 SURGERY — ROBOTIC ASSISTED TOTAL HYSTERECTOMY
Anesthesia: General | Site: Urethra

## 2013-12-07 MED ORDER — DEXTROSE-NACL 5-0.45 % IV SOLN
INTRAVENOUS | Status: DC
  Administered 2013-12-07 – 2013-12-08 (×2): via INTRAVENOUS

## 2013-12-07 MED ORDER — SCOPOLAMINE 1 MG/3DAYS TD PT72
MEDICATED_PATCH | TRANSDERMAL | Status: AC
  Filled 2013-12-07: qty 1

## 2013-12-07 MED ORDER — LACTATED RINGERS IV SOLN
INTRAVENOUS | Status: DC
  Administered 2013-12-07 (×2): via INTRAVENOUS

## 2013-12-07 MED ORDER — FENTANYL CITRATE 0.05 MG/ML IJ SOLN
INTRAMUSCULAR | Status: DC | PRN
  Administered 2013-12-07 (×5): 50 ug via INTRAVENOUS

## 2013-12-07 MED ORDER — NEOSTIGMINE METHYLSULFATE 1 MG/ML IJ SOLN
INTRAMUSCULAR | Status: AC
  Filled 2013-12-07: qty 1

## 2013-12-07 MED ORDER — MIDAZOLAM HCL 2 MG/2ML IJ SOLN
INTRAMUSCULAR | Status: DC | PRN
  Administered 2013-12-07: 2 mg via INTRAVENOUS

## 2013-12-07 MED ORDER — GLYCOPYRROLATE 0.2 MG/ML IJ SOLN
INTRAMUSCULAR | Status: DC | PRN
  Administered 2013-12-07: .5 mg via INTRAVENOUS

## 2013-12-07 MED ORDER — PROPOFOL 10 MG/ML IV BOLUS
INTRAVENOUS | Status: DC | PRN
  Administered 2013-12-07: 170 mg via INTRAVENOUS

## 2013-12-07 MED ORDER — MENTHOL 3 MG MT LOZG: 1.0000 | LOZENGE | OROMUCOSAL | Status: DC | PRN

## 2013-12-07 MED ORDER — MEPERIDINE HCL 25 MG/ML IJ SOLN: 6.2500 mg | INTRAMUSCULAR | Status: DC | PRN

## 2013-12-07 MED ORDER — ROCURONIUM BROMIDE 100 MG/10ML IV SOLN
INTRAVENOUS | Status: DC | PRN
  Administered 2013-12-07: 40 mg via INTRAVENOUS
  Administered 2013-12-07: 10 mg via INTRAVENOUS

## 2013-12-07 MED ORDER — HYDROMORPHONE 0.3 MG/ML IV SOLN
INTRAVENOUS | Status: DC
  Administered 2013-12-07: 14:00:00 via INTRAVENOUS
  Administered 2013-12-07: 0.999 mg via INTRAVENOUS
  Administered 2013-12-07: 1.39 mg via INTRAVENOUS
  Administered 2013-12-08: 4 mL via INTRAVENOUS
  Administered 2013-12-08: 0.399 mg via INTRAVENOUS
  Administered 2013-12-08: 0.999 mg via INTRAVENOUS
  Filled 2013-12-07: qty 25

## 2013-12-07 MED ORDER — PANTOPRAZOLE SODIUM 40 MG PO TBEC
40.0000 mg | DELAYED_RELEASE_TABLET | Freq: Once | ORAL | Status: AC | PRN
  Administered 2013-12-07: 40 mg via ORAL

## 2013-12-07 MED ORDER — MORPHINE SULFATE 4 MG/ML IJ SOLN
1.0000 mg | INTRAMUSCULAR | Status: DC | PRN
  Administered 2013-12-07: 1 mg via INTRAVENOUS
  Filled 2013-12-07 (×2): qty 1

## 2013-12-07 MED ORDER — KETOROLAC TROMETHAMINE 30 MG/ML IJ SOLN: 15.0000 mg | Freq: Once | INTRAMUSCULAR | Status: DC | PRN

## 2013-12-07 MED ORDER — DIPHENHYDRAMINE HCL 50 MG/ML IJ SOLN: 12.5000 mg | Freq: Four times a day (QID) | INTRAMUSCULAR | Status: DC | PRN

## 2013-12-07 MED ORDER — NALOXONE HCL 0.4 MG/ML IJ SOLN
INTRAMUSCULAR | Status: AC
  Filled 2013-12-07: qty 1

## 2013-12-07 MED ORDER — LIDOCAINE HCL (CARDIAC) 20 MG/ML IV SOLN
INTRAVENOUS | Status: AC
  Filled 2013-12-07: qty 5

## 2013-12-07 MED ORDER — SCOPOLAMINE 1 MG/3DAYS TD PT72
1.0000 | MEDICATED_PATCH | Freq: Once | TRANSDERMAL | Status: DC | PRN
  Administered 2013-12-07: 1.5 mg via TRANSDERMAL

## 2013-12-07 MED ORDER — ACETAMINOPHEN 10 MG/ML IV SOLN
1000.0000 mg | Freq: Once | INTRAVENOUS | Status: AC
  Administered 2013-12-07: 1000 mg via INTRAVENOUS
  Filled 2013-12-07: qty 100

## 2013-12-07 MED ORDER — ROCURONIUM BROMIDE 100 MG/10ML IV SOLN
INTRAVENOUS | Status: AC
  Filled 2013-12-07: qty 1

## 2013-12-07 MED ORDER — PANTOPRAZOLE SODIUM 40 MG PO TBEC
DELAYED_RELEASE_TABLET | ORAL | Status: AC
  Filled 2013-12-07: qty 1

## 2013-12-07 MED ORDER — PANTOPRAZOLE SODIUM 40 MG IV SOLR
40.0000 mg | Freq: Every day | INTRAVENOUS | Status: DC
  Administered 2013-12-07: 40 mg via INTRAVENOUS
  Filled 2013-12-07 (×2): qty 40

## 2013-12-07 MED ORDER — ROPIVACAINE HCL 5 MG/ML IJ SOLN
INTRAMUSCULAR | Status: AC
  Filled 2013-12-07: qty 60

## 2013-12-07 MED ORDER — SIMETHICONE 80 MG PO CHEW: 80.0000 mg | CHEWABLE_TABLET | Freq: Four times a day (QID) | ORAL | Status: DC | PRN

## 2013-12-07 MED ORDER — SODIUM CHLORIDE 0.9 % IJ SOLN
INTRAMUSCULAR | Status: AC
  Filled 2013-12-07: qty 50

## 2013-12-07 MED ORDER — NALOXONE HCL 0.4 MG/ML IJ SOLN
0.1000 mg | INTRAMUSCULAR | Status: DC | PRN
  Administered 2013-12-07: 0.1 mg via INTRAVENOUS

## 2013-12-07 MED ORDER — LIDOCAINE HCL (CARDIAC) 20 MG/ML IV SOLN
INTRAVENOUS | Status: DC | PRN
  Administered 2013-12-07: 80 mg via INTRAVENOUS

## 2013-12-07 MED ORDER — FENTANYL CITRATE 0.05 MG/ML IJ SOLN
INTRAMUSCULAR | Status: AC
  Administered 2013-12-07: 25 ug via INTRAVENOUS
  Filled 2013-12-07: qty 2

## 2013-12-07 MED ORDER — GLYCOPYRROLATE 0.2 MG/ML IJ SOLN
INTRAMUSCULAR | Status: AC
  Filled 2013-12-07: qty 3

## 2013-12-07 MED ORDER — KETOROLAC TROMETHAMINE 30 MG/ML IJ SOLN
INTRAMUSCULAR | Status: AC
  Filled 2013-12-07: qty 1

## 2013-12-07 MED ORDER — MIDAZOLAM HCL 2 MG/2ML IJ SOLN
INTRAMUSCULAR | Status: AC
  Filled 2013-12-07: qty 2

## 2013-12-07 MED ORDER — ALUM & MAG HYDROXIDE-SIMETH 200-200-20 MG/5ML PO SUSP: 30.0000 mL | ORAL | Status: DC | PRN

## 2013-12-07 MED ORDER — ONDANSETRON HCL 4 MG/2ML IJ SOLN
4.0000 mg | Freq: Four times a day (QID) | INTRAMUSCULAR | Status: DC
  Administered 2013-12-07 – 2013-12-08 (×4): 4 mg via INTRAVENOUS
  Filled 2013-12-07 (×4): qty 2

## 2013-12-07 MED ORDER — ONDANSETRON HCL 4 MG/2ML IJ SOLN
INTRAMUSCULAR | Status: AC
  Filled 2013-12-07: qty 2

## 2013-12-07 MED ORDER — FENTANYL CITRATE 0.05 MG/ML IJ SOLN
INTRAMUSCULAR | Status: AC
  Filled 2013-12-07: qty 5

## 2013-12-07 MED ORDER — SODIUM CHLORIDE 0.9 % IJ SOLN
INTRAMUSCULAR | Status: AC
  Filled 2013-12-07: qty 10

## 2013-12-07 MED ORDER — METOCLOPRAMIDE HCL 5 MG/ML IJ SOLN: 10.0000 mg | Freq: Once | INTRAMUSCULAR | Status: DC | PRN

## 2013-12-07 MED ORDER — ACETAMINOPHEN 325 MG PO TABS: 650.0000 mg | ORAL_TABLET | ORAL | Status: DC | PRN

## 2013-12-07 MED ORDER — DEXAMETHASONE SODIUM PHOSPHATE 10 MG/ML IJ SOLN
INTRAMUSCULAR | Status: AC
  Filled 2013-12-07: qty 1

## 2013-12-07 MED ORDER — LACTATED RINGERS IR SOLN
Status: DC | PRN
  Administered 2013-12-07: 3000 mL

## 2013-12-07 MED ORDER — NEOSTIGMINE METHYLSULFATE 1 MG/ML IJ SOLN
INTRAMUSCULAR | Status: DC | PRN
  Administered 2013-12-07: 3 mg via INTRAVENOUS

## 2013-12-07 MED ORDER — DIPHENHYDRAMINE HCL 12.5 MG/5ML PO ELIX: 12.5000 mg | ORAL_SOLUTION | Freq: Four times a day (QID) | ORAL | Status: DC | PRN

## 2013-12-07 MED ORDER — KETOROLAC TROMETHAMINE 30 MG/ML IJ SOLN
30.0000 mg | Freq: Four times a day (QID) | INTRAMUSCULAR | Status: DC
  Administered 2013-12-07 – 2013-12-08 (×4): 30 mg via INTRAVENOUS
  Filled 2013-12-07 (×4): qty 1

## 2013-12-07 MED ORDER — KETOROLAC TROMETHAMINE 30 MG/ML IJ SOLN: 30.0000 mg | Freq: Four times a day (QID) | INTRAMUSCULAR | Status: DC

## 2013-12-07 MED ORDER — ROPIVACAINE HCL 5 MG/ML IJ SOLN
INTRAMUSCULAR | Status: DC | PRN
  Administered 2013-12-07: 67 mL

## 2013-12-07 MED ORDER — OXYCODONE-ACETAMINOPHEN 5-325 MG PO TABS
1.0000 | ORAL_TABLET | ORAL | Status: DC | PRN
  Administered 2013-12-08: 2 via ORAL
  Filled 2013-12-07: qty 2

## 2013-12-07 MED ORDER — DEXAMETHASONE SODIUM PHOSPHATE 10 MG/ML IJ SOLN
INTRAMUSCULAR | Status: DC | PRN
  Administered 2013-12-07: 10 mg via INTRAVENOUS

## 2013-12-07 MED ORDER — KETOROLAC TROMETHAMINE 30 MG/ML IJ SOLN
INTRAMUSCULAR | Status: DC | PRN
  Administered 2013-12-07: 30 mg via INTRAVENOUS

## 2013-12-07 MED ORDER — STERILE WATER FOR IRRIGATION IR SOLN
Status: DC | PRN
  Administered 2013-12-07: 1000 mL via INTRAVESICAL

## 2013-12-07 MED ORDER — SODIUM CHLORIDE 0.9 % IJ SOLN: 9.0000 mL | INTRAMUSCULAR | Status: DC | PRN

## 2013-12-07 MED ORDER — FENTANYL CITRATE 0.05 MG/ML IJ SOLN
25.0000 ug | INTRAMUSCULAR | Status: DC | PRN
  Administered 2013-12-07 (×3): 25 ug via INTRAVENOUS

## 2013-12-07 MED ORDER — ONDANSETRON HCL 4 MG/2ML IJ SOLN: 4.0000 mg | Freq: Four times a day (QID) | INTRAMUSCULAR | Status: DC | PRN

## 2013-12-07 MED ORDER — PROPOFOL 10 MG/ML IV EMUL
INTRAVENOUS | Status: AC
  Filled 2013-12-07: qty 20

## 2013-12-07 MED ORDER — ONDANSETRON HCL 4 MG/2ML IJ SOLN
INTRAMUSCULAR | Status: DC | PRN
  Administered 2013-12-07: 4 mg via INTRAVENOUS

## 2013-12-07 MED ORDER — NALOXONE HCL 0.4 MG/ML IJ SOLN
0.4000 mg | INTRAMUSCULAR | Status: DC | PRN
Start: 2013-12-07 — End: 2013-12-08

## 2013-12-07 MED ORDER — NALOXONE HCL 0.4 MG/ML IJ SOLN: 0.4000 mg | INTRAMUSCULAR | Status: DC | PRN

## 2013-12-07 SURGICAL SUPPLY — 60 items
ADH SKN CLS APL DERMABOND .7 (GAUZE/BANDAGES/DRESSINGS) ×2
APL SKNCLS STERI-STRIP NONHPOA (GAUZE/BANDAGES/DRESSINGS) ×2
BARRIER ADHS 3X4 INTERCEED (GAUZE/BANDAGES/DRESSINGS) ×4 IMPLANT
BENZOIN TINCTURE PRP APPL 2/3 (GAUZE/BANDAGES/DRESSINGS) ×4 IMPLANT
BRR ADH 4X3 ABS CNTRL BYND (GAUZE/BANDAGES/DRESSINGS) ×2
CHLORAPREP W/TINT 26ML (MISCELLANEOUS) ×4 IMPLANT
CLOSURE WOUND 1/4X4 (GAUZE/BANDAGES/DRESSINGS) ×1
CLOTH BEACON ORANGE TIMEOUT ST (SAFETY) ×4 IMPLANT
CONT PATH 16OZ SNAP LID 3702 (MISCELLANEOUS) ×4 IMPLANT
COVER MAYO STAND STRL (DRAPES) ×4 IMPLANT
COVER TABLE BACK 60X90 (DRAPES) ×8 IMPLANT
COVER TIP SHEARS 8 DVNC (MISCELLANEOUS) ×2 IMPLANT
COVER TIP SHEARS 8MM DA VINCI (MISCELLANEOUS) ×2
DECANTER SPIKE VIAL GLASS SM (MISCELLANEOUS) ×4 IMPLANT
DERMABOND ADVANCED (GAUZE/BANDAGES/DRESSINGS) ×2
DERMABOND ADVANCED .7 DNX12 (GAUZE/BANDAGES/DRESSINGS) ×2 IMPLANT
DRAPE HUG U DISPOSABLE (DRAPE) ×4 IMPLANT
DRAPE LG THREE QUARTER DISP (DRAPES) ×8 IMPLANT
DRAPE WARM FLUID 44X44 (DRAPE) ×4 IMPLANT
ELECT REM PT RETURN 9FT ADLT (ELECTROSURGICAL) ×4
ELECTRODE REM PT RTRN 9FT ADLT (ELECTROSURGICAL) ×2 IMPLANT
EVACUATOR SMOKE 8.L (FILTER) ×4 IMPLANT
GAUZE VASELINE 3X9 (GAUZE/BANDAGES/DRESSINGS) IMPLANT
GLOVE BIOGEL PI IND STRL 7.0 (GLOVE) ×4 IMPLANT
GLOVE BIOGEL PI INDICATOR 7.0 (GLOVE) ×4
GLOVE ECLIPSE 6.5 STRL STRAW (GLOVE) ×16 IMPLANT
GOWN STRL REIN XL XLG (GOWN DISPOSABLE) ×24 IMPLANT
KIT ACCESSORY DA VINCI DISP (KITS) ×2
KIT ACCESSORY DVNC DISP (KITS) ×2 IMPLANT
LEGGING LITHOTOMY PAIR STRL (DRAPES) ×4 IMPLANT
NEEDLE INSUFFLATION 120MM (ENDOMECHANICALS) ×4 IMPLANT
OCCLUDER COLPOPNEUMO (BALLOONS) ×2 IMPLANT
PACK LAVH (CUSTOM PROCEDURE TRAY) ×4 IMPLANT
PAD PREP 24X48 CUFFED NSTRL (MISCELLANEOUS) ×8 IMPLANT
PROTECTOR NERVE ULNAR (MISCELLANEOUS) ×8 IMPLANT
SET CYSTO W/LG BORE CLAMP LF (SET/KITS/TRAYS/PACK) ×4 IMPLANT
SET IRRIG TUBING LAPAROSCOPIC (IRRIGATION / IRRIGATOR) ×4 IMPLANT
SOLUTION ELECTROLUBE (MISCELLANEOUS) ×4 IMPLANT
STRIP CLOSURE SKIN 1/4X4 (GAUZE/BANDAGES/DRESSINGS) ×3 IMPLANT
SUT VIC AB 0 CT1 27 (SUTURE) ×20
SUT VIC AB 0 CT1 27XBRD ANBCTR (SUTURE) ×10 IMPLANT
SUT VICRYL 0 UR6 27IN ABS (SUTURE) ×4 IMPLANT
SUT VICRYL RAPIDE 4/0 PS 2 (SUTURE) ×8 IMPLANT
SUT VLOC 180 0 9IN  GS21 (SUTURE) ×2
SUT VLOC 180 0 9IN GS21 (SUTURE) IMPLANT
SYR 50ML LL SCALE MARK (SYRINGE) ×4 IMPLANT
TIP RUMI ORANGE 6.7MMX12CM (TIP) IMPLANT
TIP UTERINE 5.1X6CM LAV DISP (MISCELLANEOUS) IMPLANT
TIP UTERINE 6.7X10CM GRN DISP (MISCELLANEOUS) ×2 IMPLANT
TIP UTERINE 6.7X6CM WHT DISP (MISCELLANEOUS) IMPLANT
TIP UTERINE 6.7X8CM BLUE DISP (MISCELLANEOUS) IMPLANT
TOWEL OR 17X24 6PK STRL BLUE (TOWEL DISPOSABLE) ×8 IMPLANT
TRAY FOLEY BAG SILVER LF 14FR (CATHETERS) ×4 IMPLANT
TROCAR DILATING TIP 12MM 150MM (ENDOMECHANICALS) ×4 IMPLANT
TROCAR DISP BLADELESS 8 DVNC (TROCAR) ×2 IMPLANT
TROCAR DISP BLADELESS 8MM (TROCAR) ×2
TROCAR XCEL NON-BLD 5MMX100MML (ENDOMECHANICALS) ×4 IMPLANT
TUBING FILTER THERMOFLATOR (ELECTROSURGICAL) ×4 IMPLANT
WARMER LAPAROSCOPE (MISCELLANEOUS) ×4 IMPLANT
WATER STERILE IRR 1000ML POUR (IV SOLUTION) ×12 IMPLANT

## 2013-12-07 NOTE — H&P (Signed)
Judy Bishop is an 43 y.o. female G3P3 with symptomatic uterine fibroids here for definitive treatement.  Ultrasound in NOvember with at least six fibroids, largest was 5cm.  Several other 2-3 cm fibroids noted.  Flow is heavy and long and patient also has pelvic pressure and fullness.  Pre-op hemoglobin is 9.5.  Pt aware.  Type and screen obtained.  She is ready for definitive therapy.  Risks/benefits discussed in my office and noted on last OV.  All questions answered today.  Patient is accompanied by her spouse.  Pertinent Gynecological History: Menses: heavy with clots Bleeding: menorrhagia Contraception: vasectomy DES exposure: denies Blood transfusions: none Sexually transmitted diseases: no past history Previous GYN Procedures: colposcopy with CIN 1  Last mammogram: normal Date: 12/14 Last pap: normal Date: 11/14 OB History: G3, P3  Menstrual History: No LMP recorded.    Past Medical History  Diagnosis Date  . Abnormal Pap smear   . GERD (gastroesophageal reflux disease)   . Anemia   . Anxiety   . PONV (postoperative nausea and vomiting)     Past Surgical History  Procedure Laterality Date  . Cesarean section  1993  . Salivary gland surgery      lump under chin removed    Family History  Problem Relation Age of Onset  . Diabetes Father   . Alzheimer's disease Paternal Grandmother     Social History:  reports that she has never smoked. She has never used smokeless tobacco. She reports that she does not drink alcohol or use illicit drugs.  Allergies: No Known Allergies  Prescriptions prior to admission  Medication Sig Dispense Refill  . ALPRAZolam (XANAX) 0.5 MG tablet Take 1 tablet (0.5 mg total) by mouth at bedtime as needed for anxiety.  20 tablet  0  . ibuprofen (ADVIL,MOTRIN) 800 MG tablet Take 1 tablet (800 mg total) by mouth every 8 (eight) hours as needed.  30 tablet  0  . IRON PO Take by mouth as needed.      . Multiple Vitamins-Minerals (MULTIVITAMIN  PO) Take by mouth daily.      Marland Kitchen omeprazole (PRILOSEC) 20 MG capsule Take 20 mg by mouth daily as needed (For heartburn).      Marland Kitchen oxyCODONE-acetaminophen (PERCOCET) 5-325 MG per tablet Take 2 tablets by mouth every 4 (four) hours as needed. use only as much as needed to relieve pain  30 tablet  0    Review of Systems  All other systems reviewed and are negative.    Blood pressure 132/95, pulse 79, temperature 97.7 F (36.5 C), temperature source Oral, resp. rate 18, SpO2 100.00%. Physical Exam  Constitutional: She is oriented to person, place, and time. She appears well-developed and well-nourished.  HENT:  Head: Normocephalic and atraumatic.  Neck: Normal range of motion. Neck supple.  Cardiovascular: Normal rate.   Respiratory: Effort normal and breath sounds normal.  GI: Soft. Bowel sounds are normal.  Musculoskeletal: Normal range of motion.  Neurological: She is alert and oriented to person, place, and time.  Skin: Skin is warm and dry.  Psychiatric: She has a normal mood and affect.    No results found for this or any previous visit (from the past 24 hour(s)).  No results found.  Assessment/Plan: 43 yo G3P3 MAAF with symptomatic uterine fibroids here for definivite surgery.  Robotic TLH/bilateral salingectomy planned.  Possible BSO if abnormality present.  All questions answered.  Patient ready to proceed.  Hale Bogus [facility] 12/07/2013, 6:19 AM

## 2013-12-07 NOTE — Addendum Note (Signed)
Addendum created 12/07/13 1603 by Asher Muir, CRNA   Modules edited: Notes Section   Notes Section:  File: [id number]

## 2013-12-07 NOTE — Anesthesia Postprocedure Evaluation (Signed)
  Anesthesia Post-op Note  Patient: Judy Bishop  Procedure(s) Performed: Procedure(s): ROBOTIC ASSISTED TOTAL HYSTERECTOMY with bilateral salpingectomy. (N/A) CYSTOSCOPY (N/A)  Patient Location: PACU  Anesthesia Type:General  Level of Consciousness: awake, alert  and oriented  Airway and Oxygen Therapy: Patient Spontanous Breathing  Post-op Pain: mild  Post-op Assessment: Post-op Vital signs reviewed, Patient's Cardiovascular Status Stable, Respiratory Function Stable, Patent Airway, No signs of Nausea or vomiting and Pain level controlled  Post-op Vital Signs: Reviewed and stable  Complications: No apparent anesthesia complications

## 2013-12-07 NOTE — Anesthesia Preprocedure Evaluation (Signed)
Anesthesia Evaluation  Patient identified by MRN, date of birth, ID band Patient awake    Reviewed: Allergy & Precautions, H&P , NPO status , Patient's Chart, lab work & pertinent test results, reviewed documented beta blocker date and time   History of Anesthesia Complications (+) PONV  Airway Mallampati: I TM Distance: >3 FB Neck ROM: full    Dental  (+) Teeth Intact   Pulmonary neg pulmonary ROS,  breath sounds clear to auscultation  Pulmonary exam normal       Cardiovascular Exercise Tolerance: Good Rhythm:regular Rate:Normal     Neuro/Psych PSYCHIATRIC DISORDERS (anxiety) negative neurological ROS     GI/Hepatic Neg liver ROS, GERD-  Medicated,  Endo/Other  negative endocrine ROS  Renal/GU   Female GU complaint     Musculoskeletal   Abdominal   Peds  Hematology  (+) Blood dyscrasia (hgb 9.5), anemia ,   Anesthesia Other Findings   Reproductive/Obstetrics negative OB ROS                           Anesthesia Physical Anesthesia Plan  ASA: II  Anesthesia Plan: General ETT   Post-op Pain Management:    Induction:   Airway Management Planned:   Additional Equipment:   Intra-op Plan:   Post-operative Plan:   Informed Consent: I have reviewed the patients History and Physical, chart, labs and discussed the procedure including the risks, benefits and alternatives for the proposed anesthesia with the patient or authorized representative who has indicated his/her understanding and acceptance.   Dental Advisory Given  Plan Discussed with: CRNA and Surgeon  Anesthesia Plan Comments:         Anesthesia Quick Evaluation

## 2013-12-07 NOTE — Anesthesia Postprocedure Evaluation (Signed)
Anesthesia Post Note  Patient: Judy Bishop  Procedure(s) Performed: Procedure(s) (LRB): ROBOTIC ASSISTED TOTAL HYSTERECTOMY with bilateral salpingectomy. (N/A) CYSTOSCOPY (N/A)  Anesthesia type: General  Patient location: [facility] Unit  Post pain: Pain level controlled  Post assessment: Post-op Vital signs reviewed  Last Vitals:  Filed Vitals:   12/07/13 1515  BP: 165/90  Pulse: 58  Temp:   Resp: 14    Post vital signs: Reviewed  Level of consciousness: sedated  Complications: No apparent anesthesia complications

## 2013-12-07 NOTE — Op Note (Signed)
12/07/2013  10:29 AM  PATIENT:  Judy Bishop  43 y.o. female  PRE-OPERATIVE DIAGNOSIS:  menorrhagia, uterine fibroids, pelvic pressure, anemia  POST-OPERATIVE DIAGNOSIS:  menorrhagia,uterine fibroids, pelvic pressure, anemia  PROCEDURE:  Procedure(s): ROBOTIC ASSISTED TOTAL HYSTERECTOMY with bilateral salpingectomy. 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 She is placed in the supine position. She is a running IV in place. Informed consent was present on the chart. SCDs on her lower extremities and functioning properly. General endotracheal anesthesia was administered by the anesthesia staff without difficulty. Dr. Royce Macadamia oversaw case. Once adequate anesthesia was confirmed the legs are placed in the low lithotomy position in [place]. The patient was already on a beanbag. Her arms were tucked by the side. The beanbag was inflated to ensure that there would be no movement during the Trendelenburg placement.  Chlor prep was then used to prep the abdomen and Betadine was used to prep the inner thighs, perineum and vagina. Once 3 minutes had past the patient was draped in a normal standard fashion. The legs were lifted to the high lithotomy position. The cervix was visualized by placing a heavy weighted speculum in the posterior aspect of the vagina and using a curved Deaver retractor to the retract anteriorly. The anterior lip of the cervix was grasped  with single-tooth tenaculum. The cervix sounded to 11 cm. Pratt dilators were used to dilate the cervix up to a #21. A RUMI uterine manipulator was obtained. A #10 disposable tip was placed on the RUMI manipulator as well as a large KOH ring. This was passed through the cervix and the bulb of the disposable tip was inflated with 10 cc of normal saline. There was a good fit of the KOH ring around the cervix. The tenaculum was removed. There is also good manipulation of the uterus. The speculum and retractor were removed as well. A Foley catheter was placed to straight drain. Clear urine was noted. Legs were lowered to the low lithotomy position and attention was turned the abdomen.  Ropivacaine mixture (0.5% mixed one-to-one with normal saline) was used anesthetize the skin above the umbilicus.  A Veress needle was obtained.   The abdomen was elevated and the needle was passed directly into the abdomen. The peritoneum was felt as a pop as it was passed with the needle. A syringe of normal saline was attached the needle and aspiration was performed. No blood or fluid was noted. Fluid was injected without difficulty and a second aspiration was performed. No fluid or blood or saline was noted. Fluid dripped easily into the needle. Low flow CO2 gas was attached the needle and the pneumoperitoneum was achieved without difficulty. Once 3.0 liters of gas was in the abdomen the Veress needle was removed and a 12 millimeter port and bladed trocar were passed directly to the abdomen. The laparoscope was then used to confirm intraperitoneal placement. There were some omental adhesions to the anterior abdominal wall.  These did not interfere with placement of the additional ports. The upper abdomen  could be surveyed without difficulty. Locations for the 1 and #2 arm ports could also be visualized. The skin was transilluminated and the skin was anesthetized with ropivacaine mixture. 26mm skin incisions were made about 10 cm  lateral to the umbilicus on each side. Then 65mm nondisposable trocar ports were passed directly into the abdomen. Also on the right lower quadrant a 5 mm skin incision was made after anesthetize the skin with the ropivacaine mixture. A 5 mm non-bladed trocar port were also passed directly into the abdomen. All trochars were removed.  60 ccs of the ropivacaine mixture was placed in the pelvis.  The table was placed on the floor and the patient was placed in steep Trendelenburg.  The robot was docked in a normal standard fashion to the left of the table. In the #1 arm was placed endoscopic scissors with monopolar cautery attached and then the #2 arm was placed PK [place] with bipolar cautery attached. The systems are right side of the table for the right lower quadrant incision was located.  The adhesions on the anterior abdominal wall were taken down with sharp dissection.  Cautery was used for hemostasis.   Once these adhesions were off, visualization of the pelvis was very easy. Both fallopian tubes had paratubal cysts.  Both ovaries were otherwise normal.  The uterus was enlarged and nodular c/w fibroids.  It was nice and mobile. There was scarring of the peritoneum where the prior bladder flap was made with prior cesarean section.  Attention was turned to the right side. Ureter was seen.  With uterus on stretch the right tube was excised off the ovary and mesosalpinx was dissected to free the tube. Then the right utero-ovarian pedicle was serially clamped cauterized and incised. Right round ligament was serially clamped cauterized and incised. The anterior and posterior peritoneum of the inferior leaf of the broad ligament were opened. The scar from prior cesarean section was dissected sharply until the bladder flap could be created. The bladder was taken down below the level of the KOH ring. The right uterine artery skeletonized and then just superior to the KOH ring this vessel was serially clamped,  cauterized, and incised.  Attention was turned the left side.  Ureter was seen.  The tube was excised off the ovary using sharp dissection a bipolar cautery.  The mesosalpinx was incised freeing the tube. Then the left uterine ovarian pedicle was serially clamped cauterized and incised. Next the left round ligament was serially clamped cauterized and incised. The anterior posterior peritoneum of the inferiorly for the broad ligament were opened. The anterior peritoneum was carried across to the dissection on the right side. The remainder of the bladder flap was created using sharp dissection. The bladder was well below the level of the KOH ring. The left uterine artery skeletonized. Then the left uterine artery, above the level of the KOH ring, was serially clamped cauterized and incised. The uterus was devascularized at this point.  The colpotomy was performed a starting in the midline and using monopolar cautery with an open edge of the scissors. This was carried around a circumferential fashion until the vaginal mucosa was completely incised in the specimen was freed.  The specimen was then delivered to the vagina.  A vaginal occlusive device was used to maintain the pneumoperitoneum  Instruments were changed with a needle cut suture driver placed in arm 1 and a Cobra grasper placed #2. A V. lock suture was passed through the middle port. Starting in  the right angle, the cuff was closed incorporating the anterior and posterior vaginal mucosa in each stitch. This was carried across all the way to the left corner and a running fashion. To stitches were brought back towards the midline and the suture was cut flush with the vagina. The needle was brought out the pelvis. The pelvis was irrigated. All pedicles were inspected. No bleeding was noted.  An Interceed was placed across vaginal cuff. Ureters were noted deep in the pelvis to be peristalsing.  At this point the procedure was completed. The instruments  were removed. The robot was undocked. The patient was taken out of Trendelenburg positioning. The ports were removed under direct vision shove laparoscope and the pneumoperitoneum was relieved. Several deep breaths were given to the patient to try and remove any gas from the abdomen.  Finally the midline port was removed.  The midline port was closed at the fascial level with figure-of-eight suture of #0 Vicryl. The skin was then closed with subcuticular stitches of 3-0 Vicryl. The skin was cleansed Dermabond was applied. Attention was then turned the vagina and the cuff was inspected. No bleeding was noted. The anterior and posterior vaginal mucosa was incorporated in each stitch. The Foley catheter was removed.  Cystoscopy was performed.  No injury or stitch in the bladder was noted.  The ureteral orifices were seen to have normal urine jets.  The foley was replaced.  Sponge, lap, needle, initially counts were correct x2. Patient tolerated the procedure very well. She was awakened from anesthesia, extubated and taken to recovery in stable condition.   COUNTS:  YES  PLAN OF CARE: Transfer to PACU

## 2013-12-07 NOTE — Progress Notes (Signed)
Day of Surgery Procedure(s) (LRB): ROBOTIC ASSISTED TOTAL HYSTERECTOMY with bilateral salpingectomy. (N/A) CYSTOSCOPY (N/A)  Subjective: Patient reports nausea.  No emesis.  Got narcan in recovery and then had significant pain once on post-op floor.  Had reduced dose Dilaudid PCA and doing well.  Has been able to stand at bedside.  Objective: I have reviewed patient's vital signs, intake and output, medications and labs.  Post op hb 9.6.  Pre-op hb 9.5.  General: alert and cooperative Resp: clear to auscultation bilaterally Cardio: regular rate and rhythm, S1, S2 normal, no murmur, click, rub or gallop GI: normal findings: soft, ND, hypoactive bowel sounds, incision: clean and dry and intact Extremities: extremities normal, atraumatic, no cyanosis or edema Vaginal Bleeding: minimal  Assessment: s/p Procedure(s): ROBOTIC ASSISTED TOTAL HYSTERECTOMY with bilateral salpingectomy. (N/A) CYSTOSCOPY (N/A): stable  Plan: Encourage ambulation Continue foley due to drowsiness with PCA Will hopefully d/c catheter in AM  Hopeful transition to po pain meds and regular diet  LOS: 0 days    Judy Bishop SUZANNE 12/07/2013, 5:27 PM

## 2013-12-07 NOTE — Preoperative (Signed)
Beta Blockers   Reason not to administer Beta Blockers:Not Applicable 

## 2013-12-07 NOTE — Addendum Note (Signed)
Addendum created 12/07/13 1158 by Billie Lade, CRNA   Modules edited: Anesthesia LDA

## 2013-12-07 NOTE — Transfer of Care (Signed)
Immediate Anesthesia Transfer of Care Note  Patient: Judy Bishop  Procedure(s) Performed: Procedure(s): ROBOTIC ASSISTED TOTAL HYSTERECTOMY with bilateral salpingectomy. (N/A) CYSTOSCOPY (N/A)  Patient Location: PACU  Anesthesia Type:General  Level of Consciousness: awake, alert  and oriented  Airway & Oxygen Therapy: Patient Spontanous Breathing and Patient connected to nasal cannula oxygen  Post-op Assessment: Report given to PACU RN, Post -op Vital signs reviewed and stable and Patient moving all extremities X 4  Post vital signs: Reviewed and stable  Complications: No apparent anesthesia complications

## 2013-12-08 ENCOUNTER — Encounter (HOSPITAL_COMMUNITY): Payer: Self-pay | Admitting: Obstetrics & Gynecology

## 2013-12-08 NOTE — Discharge Summary (Signed)
Physician Discharge Summary  Patient ID: Judy Bishop MRN: [id number] DOB/AGE: [birth year] 45 y.o.  Admit date: 12/07/2013 Discharge date: 12/08/2013  Admission Diagnoses: fibroids, anemia, pelvic pressure, menorrhagia  Discharge Diagnoses:  Anemia  Discharged Condition: good  Hospital Course: Patient admitted through same day surgery.  She was taken to OR where Robotic assisted TLH/bilateral salpingectomy/cystoscopy were performed.  Surgical findings included enlarged, fibroid laden uterus.  Surgery was uneventful.  EBL 75cc.  Foley catheter was left in place.  Patient transferred to PACU where she was stable but did have some hypoventilation felt due to intra-operative narcotics.  This was significant enough that she was given a single dose of Narcan.  From PACU, pt was transferred to the 3rd floor for the remainder of her hospitalization.  After the narcan, she began to have issues with pain management.  IV Toradol and IV morphine was not adequate for pain control.  She was given a low-dosed dilaudid PCA which did help pain control but resulted in a lot of post operative nausea.  This continued through the evening and into the morning of POD#1.  VSS/AF with good UOP.  In evening of POD #1, she was able to ambulate and she had good pain control but really struggled with the nausea.  Once her bowel sounds resumed, the PCA was discontinued and her nausea resolved.  Foley catheter was removed on POD#1 and she was also able to void on her own.  Patient seen both in the evening of POD#0 and AM of POD#1.  Post op hb was 9.6, decreased from 9.5, pre-operatively. By the afternoon of POD#1, she was voiding, walking, having excellent pain control, had no nausea, and minimal vaginal bleeding.  She was ready for D/C.  Consults: None  Significant Diagnostic Studies: labs: post op hb 9.6  Treatments: surgery: Robotic TLH/Bilateral salpingectomy/cystoscopy  Discharge Exam: Blood pressure 97/63, pulse 63,  temperature 97.5 F (36.4 C), temperature source Oral, resp. rate 18, height 5\' 5"  (1.651 m), weight 161 lb 8 oz (73.256 kg), SpO2 98.00%. General appearance: alert and cooperative Resp: clear to auscultation bilaterally Cardio: regular rate and rhythm, S1, S2 normal, no murmur, click, rub or gallop GI: soft, non-tender; bowel sounds normal; no masses,  no organomegaly Extremities: extremities normal, atraumatic, no cyanosis or edema Incision/Wound: clean/dry/intact  Disposition:    Future Appointments Provider Department Dept Phone   12/14/2013 3:00 PM Lyman Speller, MD [place] [phone]   01/04/2014 2:30 PM Lyman Speller, MD [place] [phone]   09/24/2014 11:15 AM Lyman Speller, MD [place]       Medication List         ALPRAZolam 0.5 MG tablet  Commonly known as:  XANAX  Take 1 tablet (0.5 mg total) by mouth at bedtime as needed for anxiety.     ibuprofen 800 MG tablet  Commonly known as:  ADVIL,MOTRIN  Take 1 tablet (800 mg total) by mouth every 8 (eight) hours as needed.     IRON PO  Take by mouth as needed.     MULTIVITAMIN PO  Take by mouth daily.     omeprazole 20 MG capsule  Commonly known as:  PRILOSEC  Take 20 mg by mouth daily as needed (For heartburn).     oxyCODONE-acetaminophen 5-325 MG per tablet  Commonly known as:  PERCOCET  Take 2 tablets by mouth every 4 (four) hours as needed. use only as much as needed to  relieve pain         Signed: Lyman Speller 12/08/2013, 3:52 PM

## 2013-12-08 NOTE — Telephone Encounter (Signed)
Routing to provider for final review. Patient agreeable to disposition. Will close encounter.     

## 2013-12-08 NOTE — Discharge Instructions (Signed)
Post Op Hysterectomy Instructions Please read the instructions below. Refer to these instructions for the next few weeks. These instructions provide you with general information on caring for yourself after surgery. Your caregiver may also give you specific instructions. While your treatment has been planned according to the most current medical practices available, unavoidable problems sometimes happen. If you have any problems or questions after you leave, please call your caregiver.  HOME CARE INSTRUCTIONS Healing will take time. You will have discomfort, tenderness, swelling and bruising at the operative site for a couple of weeks. This is normal and will get better as time goes on.   Only take over-the-counter or prescription medicines for pain, discomfort or fever as directed by your caregiver.   Do not take aspirin. It can cause bleeding.   Do not drive when taking pain medication.   Follow your caregivers advice regarding diet, exercise, lifting, driving and general activities.   Resume your usual diet as directed and allowed.   Get plenty of rest and sleep.   Do not douche, use tampons, or have sexual intercourse until your caregiver gives you permission. .   Take your temperature if you feel hot or flushed.   You may shower today when you get home.  No tub bath for one week.    Do not drink alcohol until you are not taking any narcotic pain medications.   Try to have someone home with you for a week or two to help with the household activities.   Be careful over the next two to three weeks with any activities at home that involve lifting, pushing, or pulling.  Listen to your body--if something feels uncomfortable to do, then don't do it.  Make sure you and your family understands everything about your operation and recovery.   Walking up stairs is fine.  Do not sign any legal documents until you feel normal again.   Keep all your follow-up appointments as recommended by  your caregiver.   PLEASE CALL THE OFFICE IF:  There is swelling, redness or increasing pain in the wound area.   Pus is coming from the wound.   You notice a bad smell from the wound or surgical dressing.   You have pain, redness and swelling from the intravenous site.   The wound is breaking open (the edges are not staying together).    You develop pain or bleeding when you urinate.   You develop abnormal vaginal discharge.   You have any type of abnormal reaction or develop an allergy to your medication.   You need stronger pain medication for your pain   SEEK IMMEDIATE MEDICAL CARE:  You develop a temperature of 100.5 or higher.   You develop abdominal pain.   You develop chest pain.   You develop shortness of breath.   You pass out.   You develop pain, swelling or redness of your leg.   You develop heavy vaginal bleeding with or without blood clots.   MEDICATIONS:  Restart your regular medications.  Please restart your iron and multivitamins due to the anemia.   Use Motrin 800mg  every 8 hours for the next several days.  This will help you use less Percocet.  Use the Percocet 5/325 1-2 tabs every 4-6 hours as needed for pain.  You may use an over the counter stool softener like Colace or Dulcolax to help with starting a bowel movement.  Start the day after you go home.  Warm liquids, fluids, and ambulation  help too.  If you have not had a bowel movement in four days, you need to call the office.

## 2013-12-08 NOTE — Progress Notes (Signed)
1 Day Post-Op Procedure(s) (LRB): ROBOTIC ASSISTED TOTAL HYSTERECTOMY with bilateral salpingectomy. (N/A) CYSTOSCOPY (N/A)  Subjective: Patient reports nausea which has improved significantly since the PCA was discontinued.  Hasn't voided yet.  Eaten regular diet.  Objective: I have reviewed patient's vital signs, intake and output, medications and labs.  General: alert and cooperative Resp: clear to auscultation bilaterally Cardio: regular rate and rhythm, S1, S2 normal, no murmur, click, rub or gallop GI: soft, non-tender; bowel sounds normal; no masses,  no organomegaly Extremities: extremities normal, atraumatic, no cyanosis or edema Vaginal Bleeding: minimal Inc:  C/D/I  Assessment: s/p Procedure(s): ROBOTIC ASSISTED TOTAL HYSTERECTOMY with bilateral salpingectomy. (N/A) CYSTOSCOPY (N/A): progressing well  Plan: Advance to PO medication Awaiting return of urinary function Hopeful discharge later  LOS: 1 day    Hale Bogus SUZANNE 12/08/2013, 11:26 AM

## 2013-12-08 NOTE — Progress Notes (Signed)
Teaching complete  Out in wheelchair  Questions answered  MD will check on pt in 2 days  Pt aware  And will  Call office as needed

## 2013-12-10 ENCOUNTER — Telehealth: Payer: Self-pay | Admitting: Obstetrics & Gynecology

## 2013-12-10 MED ORDER — PROMETHAZINE HCL 25 MG PO TABS: 12.5000 mg | ORAL_TABLET | Freq: Four times a day (QID) | ORAL | Status: DC | PRN

## 2013-12-10 NOTE — Telephone Encounter (Signed)
Message copied by Michele Mcalpine on Thu Dec 10, 2013  1:35 PM ------      Message from: Megan Salon      Created: Thu Dec 10, 2013  1:15 PM       Inform pathology just showed fibroids.  No abnormal cells.  Normal fallopian tubes. ------

## 2013-12-10 NOTE — Telephone Encounter (Signed)
Pt just had surgery and can't keep anything on her stomach. Please call to advise.

## 2013-12-10 NOTE — Telephone Encounter (Signed)
Late entry for 1227:   Spoke with patient who c/o ongoing nausea since she has been post op on 2/16. Vomited one time last night after eating dinner and taking oxycodone x 2.  Denies abdominal pain, denies pain incision sites, denies fevers, denies diarrhea.  This morning has tolerated some toast and small sips of apple juice and water. Has not vomited today, has not taken any medications today. + Flatus.  Last normal BM was 2/15.  Offered patient office visit with Dr. Sabra Heck and patient declines. She states "I am just really nauseated." I advised I would speak with Dr. Sabra Heck and return her call. Patient is agreeable.   Spoke with Dr. Sabra Heck and returned call to patient at 1245: Given normal pathology results from Dr. Sabra Heck. Advised patient per Dr. Sabra Heck, patient needs to take Motrin 800 mg q 8 hours as directed and continue taking it as prescribed. Advised patient that she does not have to take oxycodone, can now only take Motrin, advised oxycodone to be used for severe pain.  Patient to begin taking Miralax and coloace 100 mg BID.  Phenergan 12.5 mg PO q 6 hours as needed for nausea. To return call if unable to keep down fluids/food, any worsening symptoms, any fevers, unable to have BM. Patient is agreeable to plan and verbalized understanding of all instructions  Routing to provider for final review. Patient agreeable to disposition. Will close encounter

## 2013-12-11 NOTE — Telephone Encounter (Signed)
Spoke with pt just to check on her.  Off pain medicine except Motrin and "feels so much better".  Had small bowel movement today.  Taking Colace.  D/W pt miralax.  Reviewed pathology report.  No bleeding.  Voiding well.  All questions answered.

## 2013-12-14 ENCOUNTER — Ambulatory Visit (INDEPENDENT_AMBULATORY_CARE_PROVIDER_SITE_OTHER): Payer: PRIVATE HEALTH INSURANCE | Admitting: Obstetrics & Gynecology

## 2013-12-14 ENCOUNTER — Encounter: Payer: Self-pay | Admitting: Obstetrics & Gynecology

## 2013-12-14 VITALS — BP 118/78 | HR 80 | Resp 20 | Ht 64.75 in | Wt 164.0 lb

## 2013-12-14 DIAGNOSIS — Z9889 Other specified postprocedural states: Secondary | ICD-10-CM

## 2013-12-14 NOTE — Patient Instructions (Signed)
Call with any new issues.

## 2013-12-14 NOTE — Progress Notes (Signed)
Post Operative Visit  Procedure: Robotic Assisted Total Hysterectomy/bilateral salpingectomy/cystoscopy  Days Post-op: 8 days  Subjective: Doing really well.  No vaginal bleeding.  Not taking any narcotics or ibuprofen.  Voiding well.  Having bowel movements.  Hasn't driven yet.  D/w pt this is ok.  Pathology and pictures reviewed.     Objective: BP 118/78  Pulse 80  Resp 20  Ht 5' 4.75" (1.645 m)  Wt 164 lb (74.39 kg)  BMI 27.49 kg/m2  LMP 11/21/2013  EXAM General: alert and cooperative Resp: clear to auscultation bilaterally Cardio: regular rate and rhythm, S1, S2 normal, no murmur, click, rub or gallop GI: soft, non-tender; bowel sounds normal; no masses,  no organomegaly Extremities: extremities normal, atraumatic, no cyanosis or edema Vaginal Bleeding: none  Assessment: s/p Robotic TLH/bilateral salpingectomy/cystoscopy  Plan: Recheck 3 weeks

## 2013-12-21 ENCOUNTER — Encounter: Payer: Self-pay | Admitting: Nurse Practitioner

## 2013-12-21 ENCOUNTER — Telehealth: Payer: Self-pay | Admitting: Obstetrics & Gynecology

## 2013-12-21 ENCOUNTER — Ambulatory Visit (INDEPENDENT_AMBULATORY_CARE_PROVIDER_SITE_OTHER): Payer: PRIVATE HEALTH INSURANCE | Admitting: Nurse Practitioner

## 2013-12-21 VITALS — BP 110/74 | HR 92 | Ht 65.0 in | Wt 161.0 lb

## 2013-12-21 DIAGNOSIS — B3731 Acute candidiasis of vulva and vagina: Secondary | ICD-10-CM

## 2013-12-21 DIAGNOSIS — B373 Candidiasis of vulva and vagina: Secondary | ICD-10-CM

## 2013-12-21 MED ORDER — FLUCONAZOLE 150 MG PO TABS: 150.0000 mg | ORAL_TABLET | Freq: Once | ORAL | Status: DC

## 2013-12-21 NOTE — Progress Notes (Signed)
Reviewed personally.  M. Suzanne Kahron Kauth, MD.  

## 2013-12-21 NOTE — Progress Notes (Signed)
Subjective:     Patient ID: Judy Bishop, female   DOB: [birth year], 43 y.o.   MRN: [id number]  HPI  This 43 yo MAA Fe presents with vaginal itching that started 5 days ago. External symptoms only.  No change in personal products. She is S/P Robotic assisted Hysterectomy/ bilateral salpingectomy and cystoscopy 12/07/13.  She is currently trying to increase her exercise and tolerance level.  She denies any vaginal intercourse and no urinary symptoms.   Review of Systems  Constitutional: Negative for fever, chills and fatigue.  Respiratory: Negative.   Cardiovascular: Negative.   Gastrointestinal: Negative.  Negative for nausea, vomiting, abdominal pain, diarrhea and constipation.  Genitourinary: Positive for vaginal discharge. Negative for dysuria, frequency, hematuria, flank pain, decreased urine volume, vaginal bleeding, vaginal pain and pelvic pain.  Musculoskeletal: Negative.   Skin: Negative.   Neurological: Negative.   Psychiatric/Behavioral: Negative.        Objective:   Physical Exam  Constitutional: She is oriented to person, place, and time. She appears well-developed and well-nourished. No distress.  Abdominal: Soft. She exhibits no distension. There is no tenderness. There is no rebound and no guarding.  No flank pain  Genitourinary:  White thick vaginal discharge.speculum exam was done very gently and was able to obtain small amount of brown tinged discharge.   Wet Prep:  Ph 4.5; Koh: + yeast; NSS: few RBC, no clue cells  Neurological: She is alert and oriented to person, place, and time.  Psychiatric: She has a normal mood and affect. Her behavior is normal. Judgment and thought content normal.       Assessment:     Yeast vaginitis S/P Robotic assisted Hysterectomy/ bilateral salpingectomy and cysto 12/07/13.    Plan:     Diflucan 150 mg # 2/1 Given precautions about not using vaginal preparations If symptoms persist to call back.  She has a recheck appointment in  about 2 weeks.

## 2013-12-21 NOTE — Telephone Encounter (Signed)
Spoke with pt who has noticed some itching for about 4 days. Not sure if it is dry skin or yeast. Pt able to come in today to see PG. Sched OV at 2:15.

## 2013-12-21 NOTE — Telephone Encounter (Signed)
LMTCB

## 2013-12-21 NOTE — Telephone Encounter (Signed)
Patient has a question for Dr. Ammie Ferrier nurse. (no details given.)

## 2013-12-28 ENCOUNTER — Telehealth: Payer: Self-pay | Admitting: Gynecology

## 2013-12-28 NOTE — Telephone Encounter (Addendum)
Pt called reporting some pelvic cramping and passage of 3 clots after shopping.  Pt denies any SOB, weakness.  Pt is s/p TLH, no bleeding before this.  Pt offered ER assessment, did not want to go. I asked her to put on a pad and called her back after 1h.  Pt reports less than 1 tsp on pad, advised to rest remainder of day and to call if she has any more bleeding, pt agreeable to plan

## 2013-12-28 NOTE — Telephone Encounter (Signed)
Kelly Please call and check on her first thing Tues if possible.

## 2013-12-29 NOTE — Telephone Encounter (Signed)
Spoke with patient states she has not had anymore bleeding, just some occ cramping. She did feel weak/tired a couple of days ago, before this episode. No issues at this time, but will monitor symptoms and call back PRN. Aware will let Dr Sabra Heck and Dr Charlies Constable know this and will call her back if they feel she needs to be seen. Please advise.

## 2014-01-04 ENCOUNTER — Encounter: Payer: Self-pay | Admitting: Obstetrics & Gynecology

## 2014-01-04 ENCOUNTER — Ambulatory Visit (INDEPENDENT_AMBULATORY_CARE_PROVIDER_SITE_OTHER): Payer: PRIVATE HEALTH INSURANCE | Admitting: Obstetrics & Gynecology

## 2014-01-04 VITALS — BP 114/68 | HR 68 | Resp 16 | Ht 65.0 in | Wt 162.2 lb

## 2014-01-04 DIAGNOSIS — Z9889 Other specified postprocedural states: Secondary | ICD-10-CM

## 2014-01-04 DIAGNOSIS — D649 Anemia, unspecified: Secondary | ICD-10-CM

## 2014-01-04 LAB — CBC
HCT: 31.5 % — ABNORMAL LOW (ref 36.0–46.0)
HEMOGLOBIN: 10.2 g/dL — AB (ref 12.0–15.0)
MCH: 26.2 pg (ref 26.0–34.0)
MCHC: 32.4 g/dL (ref 30.0–36.0)
MCV: 80.8 fL (ref 78.0–100.0)
PLATELETS: 263 10*3/uL (ref 150–400)
RBC: 3.9 MIL/uL (ref 3.87–5.11)
RDW: 16 % — ABNORMAL HIGH (ref 11.5–15.5)
WBC: 6.7 10*3/uL (ref 4.0–10.5)

## 2014-01-04 NOTE — Patient Instructions (Signed)
Pro-biotic.  Align.  You can try it for a month.

## 2014-01-04 NOTE — Progress Notes (Signed)
Post Operative Visit  Procedure:Robotic Assisted Total Hysterectomy/Cystoscopy Days Post-op: 29 days  Subjective: Had a single episode of vaginal bleeding on 3/9th.  Had been out shopping.  Hadn't done any heavy lifting.  No intercourse.  Still adjusting with bowel movements.  Still taking colace and having a bowel movement about every three days.  Normal urinary function.  Having some trouble relaxing at night.  Not on any pain medicine.  Has the ibuprofen but hasn't taken it.    Objective: BP 114/68  Pulse 68  Resp 16  Ht 5\' 5"  (1.651 m)  Wt 162 lb 3.2 oz (73.573 kg)  BMI 26.99 kg/m2  LMP 11/21/2013  EXAM General: alert and cooperative Resp: clear to auscultation bilaterally Cardio: regular rate and rhythm, S1, S2 normal, no murmur, click, rub or gallop GI: soft, non-tender; bowel sounds normal; no masses,  no organomegaly and incision: clean, dry and intact Extremities: extremities normal, atraumatic, no cyanosis or edema Vaginal Bleeding: none and cuff healing well.  no discharge or blood.  no firmness, tenderness, or masses  Assessment: s/p robotic TLH/bilateral salpingectomy, cystoscopy.    Plan: CBC today.  If improving, plan AEX 1 year.

## 2014-01-07 NOTE — Telephone Encounter (Signed)
Patient says she is returning a call to [place].

## 2014-01-12 NOTE — Telephone Encounter (Signed)
Spoke with pt about HGB result of 10.2. Advised pt that was better than before, but still not quite normal yet. Advised SM would like a repeat CBC in 3 months. Pt has an appt with her PCP in June, and she can have it done there and have results sent to Dr. Sabra Heck. Pt would also like a return to work note for 05-22-14. Pt usually works three 12 hour shifts, but says Dr. Sabra Heck doesn't want her doing those yet. Pt can't remember what Dr. Sabra Heck recommended as work hour limit for her. Pt can come by tomorrow after lunch to pick up note.  Dr. Sabra Heck, what work schedule will be OK for pt when she returns 05-22-14?

## 2014-01-12 NOTE — Telephone Encounter (Signed)
Patient says she is returning a call to [place]. Patient is also asking for a return to work note with hours she can work .

## 2014-01-13 NOTE — Telephone Encounter (Signed)
Do you have the date right?  August 1st?  She is already over a month post op.

## 2014-01-13 NOTE — Telephone Encounter (Signed)
I think she should work 6-8 hour shifts for the next month if possible.  If she feels like she has enough energy for more hours, she can let me know and I will change her note.  Thanks.

## 2014-01-13 NOTE — Telephone Encounter (Signed)
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 She wanted to know if Dr. Sabra Heck thinks she can work these 3 12 hour shifts in a row or shortened shifts or if has any restrictions and if there are restrictions, when can they be lifted.   Advised I would call her when note was ready for pick up.   I can write/print note, Dr. Sabra Heck after dates/restrictions are clarified with you.

## 2014-01-15 ENCOUNTER — Encounter: Payer: Self-pay | Admitting: Emergency Medicine

## 2014-01-15 NOTE — Telephone Encounter (Signed)
Spoke with patient. Advised letter to return to work is ready for pick up. Instructed she is restricted to working 6-8 hour shifts instead of 12 hour shifts for the first month unless she feels she can do more. In which case she should call back to let us know so we can write a new letter. Patient verbalizes understanding and is agreeable. States she will come to get letter today.  Routing to provider for final review. Patient agreeable to disposition. Will close encounter

## 2014-02-15 ENCOUNTER — Encounter: Payer: Self-pay | Admitting: Obstetrics & Gynecology

## 2014-02-16 ENCOUNTER — Encounter: Payer: Self-pay | Admitting: Emergency Medicine

## 2014-08-23 ENCOUNTER — Encounter: Payer: Self-pay | Admitting: Obstetrics & Gynecology

## 2014-09-24 ENCOUNTER — Ambulatory Visit (INDEPENDENT_AMBULATORY_CARE_PROVIDER_SITE_OTHER): Payer: PRIVATE HEALTH INSURANCE | Admitting: Obstetrics & Gynecology

## 2014-09-24 ENCOUNTER — Encounter: Payer: Self-pay | Admitting: Obstetrics & Gynecology

## 2014-09-24 VITALS — BP 120/78 | HR 64 | Resp 16 | Ht 64.5 in | Wt 163.8 lb

## 2014-09-24 DIAGNOSIS — M5440 Lumbago with sciatica, unspecified side: Secondary | ICD-10-CM

## 2014-09-24 DIAGNOSIS — Z01419 Encounter for gynecological examination (general) (routine) without abnormal findings: Secondary | ICD-10-CM

## 2014-09-24 DIAGNOSIS — Z0000 Encounter for general adult medical examination without abnormal findings: Secondary | ICD-10-CM

## 2014-09-24 LAB — POCT URINALYSIS DIPSTICK
Bilirubin, UA: NEGATIVE
Glucose, UA: NEGATIVE
Ketones, UA: NEGATIVE
LEUKOCYTES UA: NEGATIVE
NITRITE UA: NEGATIVE
PH UA: 5
UROBILINOGEN UA: NEGATIVE

## 2014-09-24 NOTE — Progress Notes (Signed)
43 y.o. G3P3 MarriedAfrican AmericanF here for annual exam.  Doing well.  No vaginal bleeding.  Energy is good.  Reports she had some breast pain last month.    Patient's last menstrual period was 11/21/2013.          Sexually active: Yes.    The current method of family planning is vasectomy and status post hysterectomy.    Exercising: Yes.    cycling Smoker:  no  Health Maintenance: Pap:  08/28/13 WNL/negative HR HPV History of abnormal Pap:  Yes h/o CIN I.  Pathology of cervix with hysterectomy was negative. MMG:  10/06/13-normal.  Scheduled for 10/07/14. Colonoscopy:  none BMD:   none TDaP:  Up to date Screening Labs: , Hb today: 12.1, Urine today: PROTEIN-trace, RBC-trace   reports that she has never smoked. She has never used smokeless tobacco. She reports that she drinks about 0.6 - 1.2 oz of alcohol per week. She reports that she does not use illicit drugs.  Past Medical History  Diagnosis Date  . Abnormal Pap smear   . GERD (gastroesophageal reflux disease)   . Anemia   . Anxiety   . PONV (postoperative nausea and vomiting)     Past Surgical History  Procedure Laterality Date  . Cesarean section  1993  . Salivary gland surgery      lump under chin removed  . Robotic assisted total hysterectomy N/A 12/07/2013    Procedure: ROBOTIC ASSISTED TOTAL HYSTERECTOMY with bilateral salpingectomy.;  Surgeon: Lyman Speller, MD;  Location: [facility];  Service: Gynecology;  Laterality: N/A;  . Cystoscopy N/A 12/07/2013    Procedure: CYSTOSCOPY;  Surgeon: Lyman Speller, MD;  Location: [facility];  Service: Gynecology;  Laterality: N/A;    Current Outpatient Prescriptions  Medication Sig Dispense Refill  . IRON PO Take by mouth as needed.    . Multiple Vitamins-Minerals (MULTIVITAMIN PO) Take by mouth daily.    . Naproxen Sodium (ALEVE PO) Take by mouth as needed.    Marland Kitchen omeprazole (PRILOSEC) 20 MG capsule Take 20 mg by mouth daily as needed (For heartburn).     No current  facility-administered medications for this visit.    Family History  Problem Relation Age of Onset  . Diabetes Father   . Alzheimer's disease Paternal Grandmother     ROS:  Pertinent items are noted in HPI.  Otherwise, a comprehensive ROS was negative.  Exam:   BP 120/78 mmHg  Pulse 64  Resp 16  Ht 5' 4.5" (1.638 m)  Wt 163 lb 12.8 oz (74.299 kg)  BMI 27.69 kg/m2  LMP 11/21/2013  Weight:  Stable   Height: 5' 4.5" (163.8 cm)  Ht Readings from Last 3 Encounters:  09/24/14 5' 4.5" (1.638 m)  01/04/14 5\' 5"  (1.651 m)  12/21/13 5\' 5"  (1.651 m)    General appearance: alert, cooperative and appears stated age Head: Normocephalic, without obvious abnormality, atraumatic Neck: no adenopathy, supple, symmetrical, trachea midline and thyroid normal to inspection and palpation Lungs: clear to auscultation bilaterally Breasts: normal appearance, no masses or tenderness Heart: regular rate and rhythm Abdomen: soft, non-tender; bowel sounds normal; no masses,  no organomegaly Extremities: extremities normal, atraumatic, no cyanosis or edema Skin: Skin color, texture, turgor normal. No rashes or lesions Lymph nodes: Cervical, supraclavicular, and axillary nodes normal. No abnormal inguinal nodes palpated Neurologic: Grossly normal   Pelvic: External genitalia:  no lesions              Urethra:  normal  appearing urethra with no masses, tenderness or lesions              Bartholins and Skenes: normal                 Vagina: normal appearing vagina with normal color and discharge, no lesions              Cervix: absent              Pap taken: No. Bimanual Exam:  Uterus:  uterus absent              Adnexa: no mass, fullness, tenderness               Rectovaginal: Confirms               Anus:  normal sphincter tone, no lesions  A:  Well Woman with normal exam H/O Robotic TLH/bilateral salpingectomy/cystoscopy 2/15.  Doing well. H/O anemia and menorrhagia that resolved with  hysterectomy Low back pain with pain down back of legs  P: Mammogram yearly Labs last year.   No pap needed.  Neg pap with neg HR HPV 11/14.  Negative cervical pathology at time of hysterectomy. Pt and I discussed she being seen every other year as she is doing well. Referral to Dr. Lynann Bologna for evaluation of back/leg pain return annually or prn An After Visit Summary was printed and given to the patient.

## 2014-09-27 LAB — HEMOGLOBIN, FINGERSTICK: HEMOGLOBIN, FINGERSTICK: 12.1 g/dL (ref 12.0–16.0)

## 2014-10-01 ENCOUNTER — Telehealth: Payer: Self-pay | Admitting: Obstetrics & Gynecology

## 2014-10-01 NOTE — Telephone Encounter (Signed)
Left message for patient advising of 2:00 appt with Dr Lynann Bologna 12.17.2015. Left their telephone # for rescheduling purposes.

## 2015-02-28 IMAGING — US US TRANSVAGINAL NON-OB
1 series · 13 of 25 positions shown · non-contrast
Comparison: None

CLINICAL DATA: Back pain, heavy menstrual cycles, history of
fibroids

EXAM:
TRANSABDOMINAL AND TRANSVAGINAL ULTRASOUND OF PELVIS
TECHNIQUE: Both transabdominal and transvaginal ultrasound examinations of the
pelvis were performed. Transabdominal technique was performed for
global imaging of the pelvis including uterus, ovaries, adnexal
regions, and pelvic cul-de-sac. It was necessary to proceed with
endovaginal exam following the transabdominal exam to visualize the
ovaries and endometrium.

[Series 1: us transvaginal non-ob · 0.27mm/px · 13 of 69 slices shown]
[im 1/69]
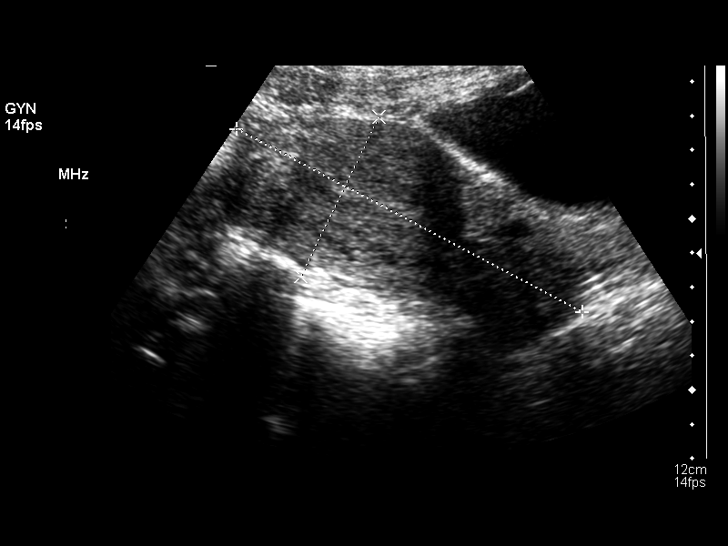
[im 6/69]
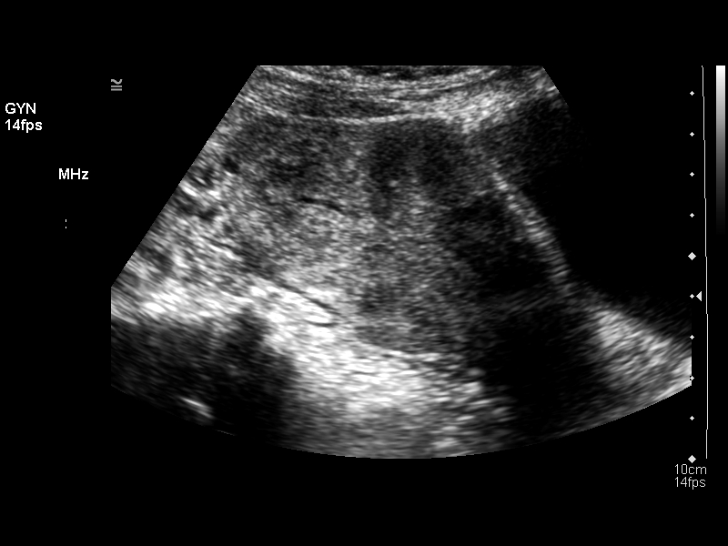
[im 12/69]
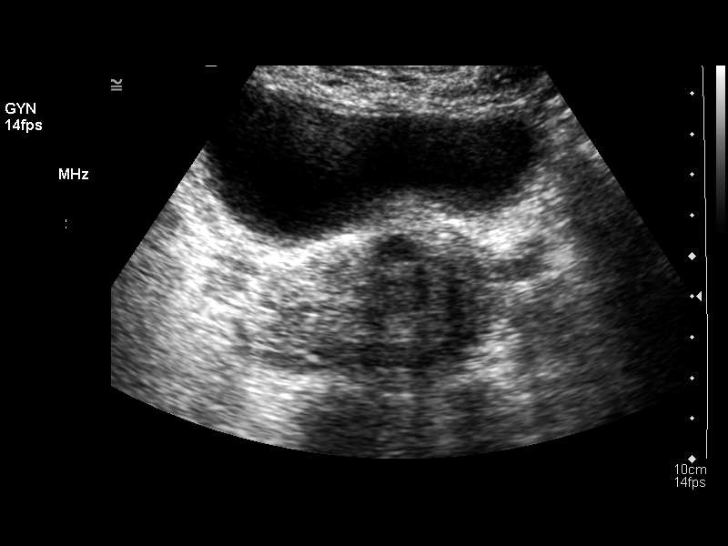
[im 18/69]
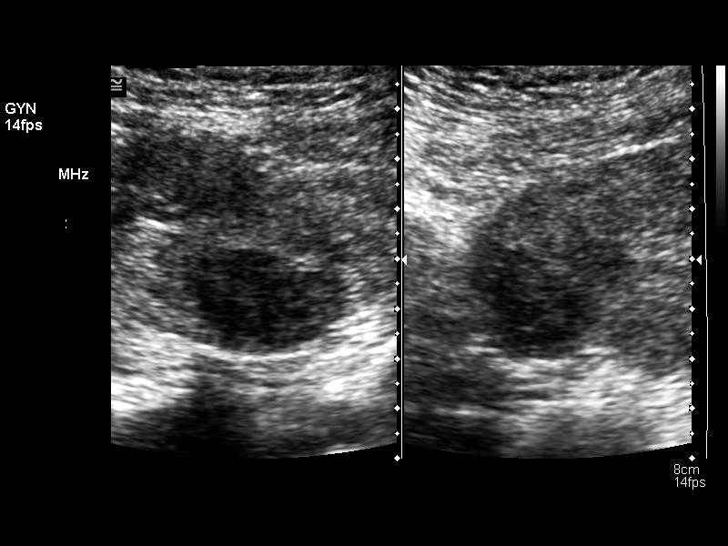
[im 23/69]
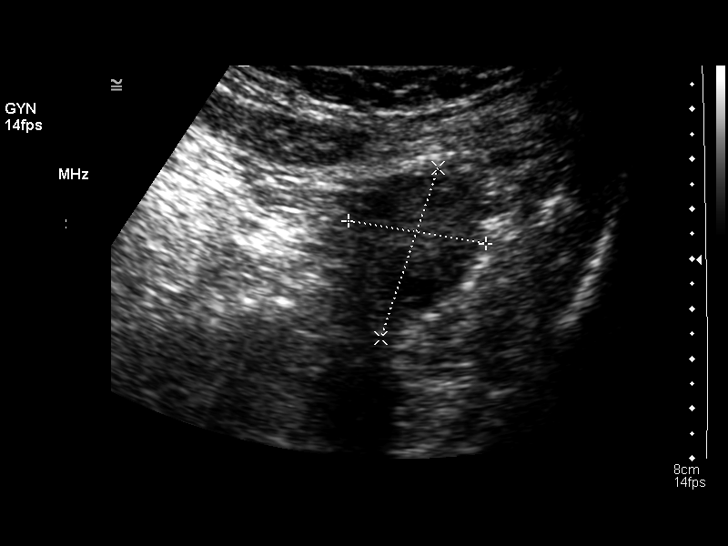
[im 29/69]
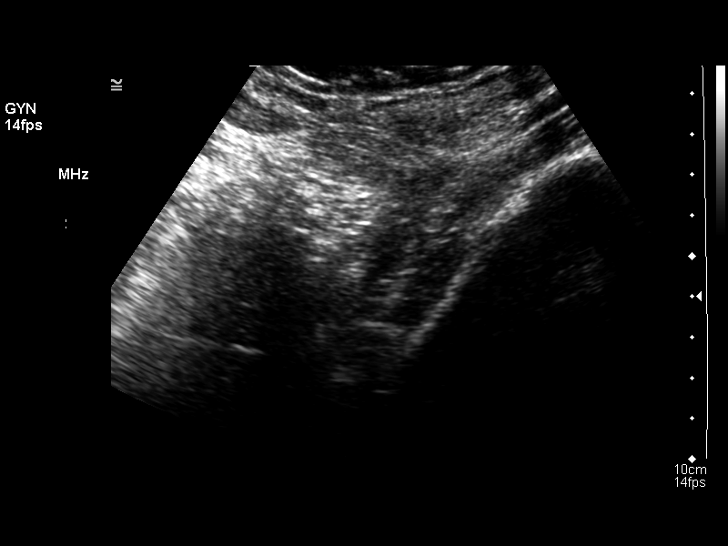
[im 35/69]
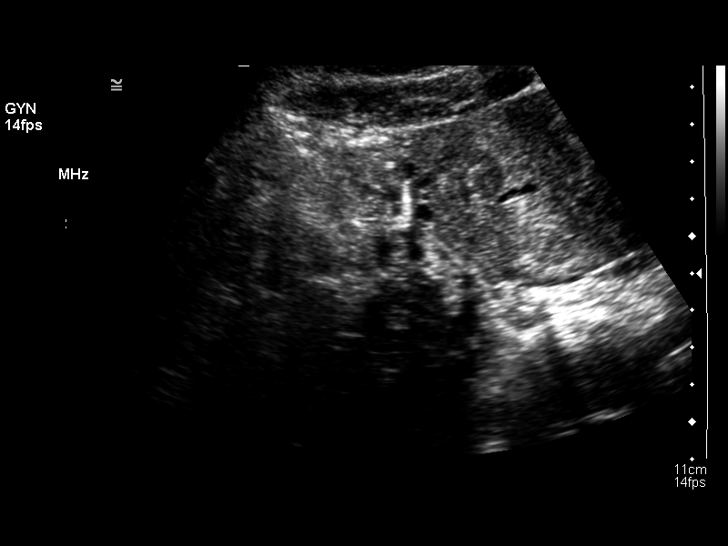
[im 40/69]
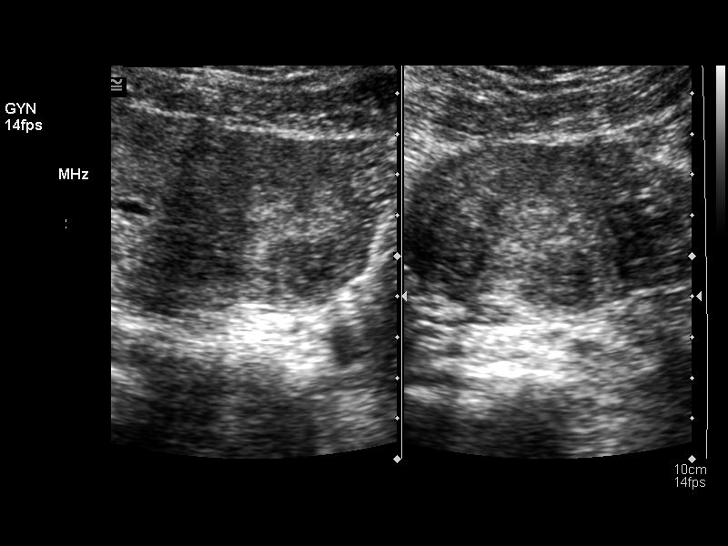
[im 46/69]
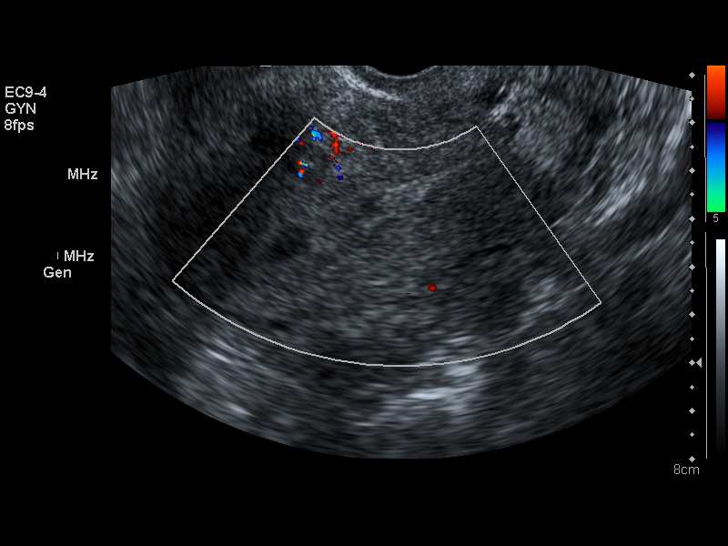
[im 52/69]
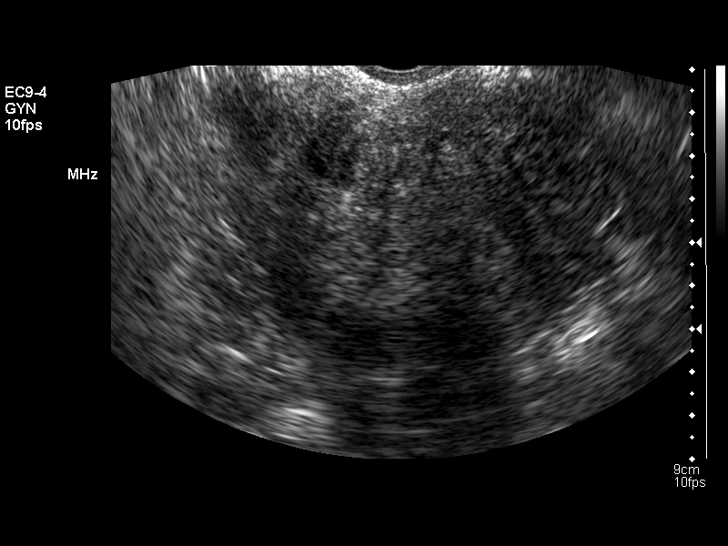
[im 57/69]
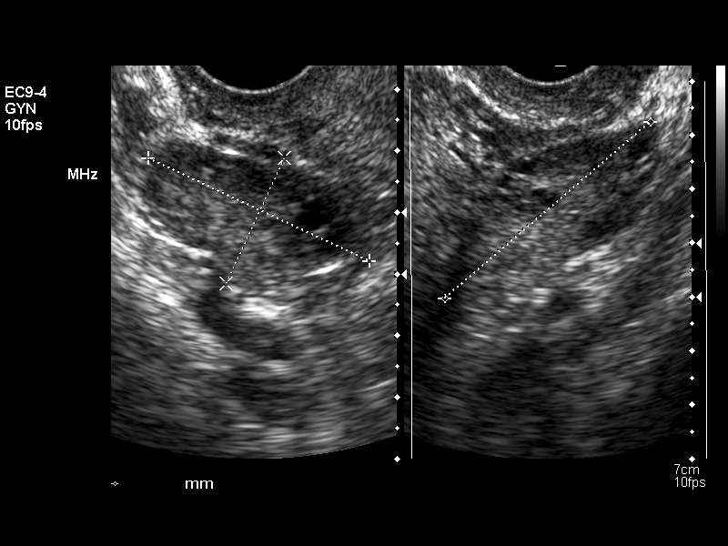
[im 63/69]
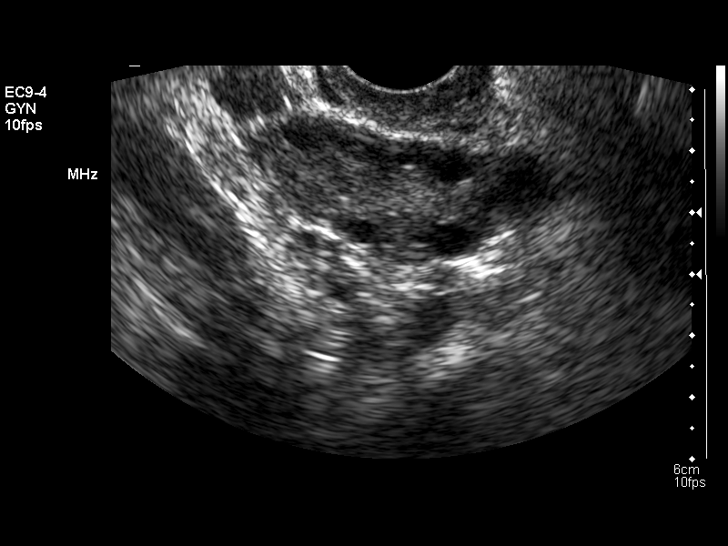
[im 69/69]
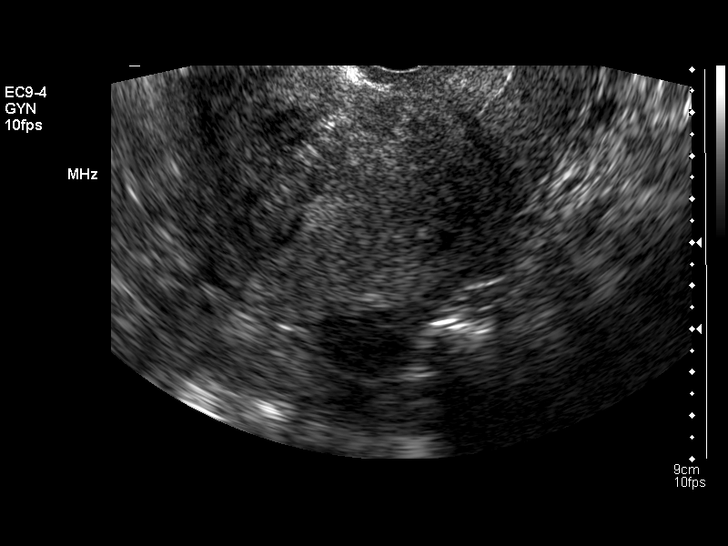

[13 of 25 positions shown; findings below may reference images not displayed]

FINDINGS: Uterus

Measurements: The uterus measures 11.4 cm sagittally with a depth of
5.2 cm and with of 8.8 cm.. Several fibroids are present. There are
at least 3 fibroids in the fundus of the uterus of 2.8 x 4.7 x
cm, 3.5 x 2.2 x 2.4 cm, and 2.1 x 1.5 x 2.5 cm. An anterior upper
body fibroid measures 2.8 x 2.7 x 3.0 cm. A posterior uterine body
fibroid measures 2.1 x 1.8 x 2.1 cm. A fibroid in the left lateral
body of the uterus measures 2.2 x 2.0 x 2.0 cm.

Endometrium

Thickness: The endometrium measures 10.8 mm in thickness. Assessment
of the endometrium is somewhat limited by the multiple fibroids
present..

Right ovary

Measurements: The right ovary measures 4.0 x 2.3 x 5.0 cm.. Small
follicles are present.

Left ovary

Measurements: The left ovary measures 2.8 x 3.6 x 3.3 cm.. Small
ovarian follicles are noted.

Other findings

No free fluid is seen.
IMPRESSION: 1. At least 6 measurable uterine fibroids, the largest of 4.7 cm,
located in the fundus of the uterus.
2. The endometrium measures 10.8 mm in thickness but is not well
seen due to the multiple fibroids.
3. Small ovarian follicles bilaterally.

## 2015-08-30 ENCOUNTER — Encounter: Payer: Self-pay | Admitting: Nurse Practitioner

## 2015-08-30 ENCOUNTER — Telehealth: Payer: Self-pay | Admitting: Nurse Practitioner

## 2015-08-30 ENCOUNTER — Ambulatory Visit (INDEPENDENT_AMBULATORY_CARE_PROVIDER_SITE_OTHER): Payer: PRIVATE HEALTH INSURANCE | Admitting: Nurse Practitioner

## 2015-08-30 VITALS — BP 124/82 | HR 68 | Ht 64.5 in | Wt 163.0 lb

## 2015-08-30 DIAGNOSIS — N760 Acute vaginitis: Secondary | ICD-10-CM | POA: Diagnosis not present

## 2015-08-30 MED ORDER — FLUCONAZOLE 150 MG PO TABS: 150.0000 mg | ORAL_TABLET | Freq: Once | ORAL | Status: DC

## 2015-08-30 NOTE — Telephone Encounter (Signed)
Patient wants to come in today for yeast infection. No available slots

## 2015-08-30 NOTE — Telephone Encounter (Signed)
Spoke with patient. Patient states that she is experiencing vaginal dryness and itching. Would like to be seen today for evaluation. Appointment scheduled for today at 4 pm with Kem Boroughs, FNP. Agreeable to date and time and to see Kem Boroughs, FNP.  Routing to provider for final review. Patient agreeable to disposition. Will close encounter.

## 2015-08-30 NOTE — Patient Instructions (Addendum)
Monilial Vaginitis Vaginitis in a soreness, swelling and redness (inflammation) of the vagina and vulva. Monilial vaginitis is not a sexually transmitted infection. CAUSES  Yeast vaginitis is caused by yeast (candida) that is normally found in your vagina. With a yeast infection, the candida has overgrown in number to a point that upsets the chemical balance. SYMPTOMS   White, thick vaginal discharge.  Swelling, itching, redness and irritation of the vagina and possibly the lips of the vagina (vulva).  Burning or painful urination.  Painful intercourse. DIAGNOSIS  Things that may contribute to monilial vaginitis are:  Postmenopausal and virginal states.  Pregnancy.  Infections.  Being tired, sick or stressed, especially if you had monilial vaginitis in the past.  Diabetes. Good control will help lower the chance.  Birth control pills.  Tight fitting garments.  Using bubble bath, feminine sprays, douches or deodorant tampons.  Taking certain medications that kill germs (antibiotics).  Sporadic recurrence can occur if you become ill. TREATMENT  Your caregiver will give you medication.  There are several kinds of anti monilial vaginal creams and suppositories specific for monilial vaginitis. For recurrent yeast infections, use a suppository or cream in the vagina 2 times a week, or as directed.  Anti-monilial or steroid cream for the itching or irritation of the vulva may also be used. Get your caregiver's permission.  Painting the vagina with methylene blue solution may help if the monilial cream does not work.  Eating yogurt may help prevent monilial vaginitis. HOME CARE INSTRUCTIONS   Finish all medication as prescribed.  Do not have sex until treatment is completed or after your caregiver tells you it is okay.  Take warm sitz baths.  Do not douche.  Do not use tampons, especially scented ones.  Wear cotton underwear.  Avoid tight pants and panty  hose.  Tell your sexual partner that you have a yeast infection. They should go to their caregiver if they have symptoms such as mild rash or itching.  Your sexual partner should be treated as well if your infection is difficult to eliminate.  Practice safer sex. Use condoms.  Some vaginal medications cause latex condoms to fail. Vaginal medications that harm condoms are:  Cleocin cream.  Butoconazole (Femstat).  Terconazole (Terazol) vaginal suppository.  Miconazole (Monistat) (may be purchased over the counter). SEEK MEDICAL CARE IF:   You have a temperature by mouth above 102 F (38.9 C).  The infection is getting worse after 2 days of treatment.  The infection is not getting better after 3 days of treatment.  You develop blisters in or around your vagina.  You develop vaginal bleeding, and it is not your menstrual period.  You have pain when you urinate.  You develop intestinal problems.  You have pain with sexual intercourse.   This information is not intended to replace advice given to you by your health care provider. Make sure you discuss any questions you have with your health care provider.   Document Released: 07/18/2005 Document Revised: 12/31/2011 Document Reviewed: 04/11/2015 Elsevier Interactive Patient Education 2016 Zearing B is OTC probiotic

## 2015-08-30 NOTE — Progress Notes (Signed)
Patient ID: Judy Bishop, female   DOB: June 20, 1971, 44 y.o.   MRN: [id number] 44 y.o. Married [place] American female G3P3 here with complaint of vaginal symptoms of itching, burning, and increase discharge. Describes discharge as minimal.  External vaginal itching, dryness, burning. Onset of symptoms 5 days ago. Denies new personal products or vaginal dryness. No STD concerns. Urinary symptoms urine frequency os not uncommon secondary to consuming a lot of fluids.    LMP: post hysterotomy   O:  Healthy female WDWN Affect: normal, orientation x 3  Exam: Abdomen: Lymph node: no enlargement or tenderness Pelvic exam: External genital: normal female BUS: negative Vagina:white discharge noted.  Affirm taken. Cervix: absent Uterus: absent Adnexa: not examined   A: Vaginitis - most likely yeast   P: Discussed findings of vaginitis and etiology. Discussed Aveeno or baking soda sitz bath for comfort. Avoid moist clothes or pads for extended period of time. If working out in gym clothes or swim suits for long periods of time change underwear or bottoms of swimsuit if possible. Olive Oil/Coconut Oil use for skin protection prior to activity can be used to external skin.  Rx: Diflucan 150 mg X 2  Follow with Affirm  RV prn

## 2015-08-31 LAB — WET PREP BY MOLECULAR PROBE
Candida species: POSITIVE — AB
Gardnerella vaginalis: NEGATIVE
Trichomonas vaginosis: NEGATIVE

## 2015-09-04 NOTE — Progress Notes (Signed)
Encounter reviewed by Dr. Brook Amundson C. Silva.  

## 2016-07-11 DIAGNOSIS — F410 Panic disorder [episodic paroxysmal anxiety] without agoraphobia: Secondary | ICD-10-CM | POA: Insufficient documentation

## 2016-07-11 DIAGNOSIS — F411 Generalized anxiety disorder: Secondary | ICD-10-CM | POA: Insufficient documentation

## 2016-07-11 DIAGNOSIS — F988 Other specified behavioral and emotional disorders with onset usually occurring in childhood and adolescence: Secondary | ICD-10-CM | POA: Insufficient documentation

## 2016-07-11 DIAGNOSIS — K219 Gastro-esophageal reflux disease without esophagitis: Secondary | ICD-10-CM | POA: Insufficient documentation

## 2016-07-30 DIAGNOSIS — E782 Mixed hyperlipidemia: Secondary | ICD-10-CM | POA: Insufficient documentation

## 2016-07-30 DIAGNOSIS — R7303 Prediabetes: Secondary | ICD-10-CM | POA: Insufficient documentation

## 2017-06-20 ENCOUNTER — Telehealth: Payer: Self-pay | Admitting: Obstetrics & Gynecology

## 2017-06-20 NOTE — Telephone Encounter (Signed)
Patient is having right side breast pain.

## 2017-06-20 NOTE — Telephone Encounter (Signed)
Spoke with patient. Patient states that she has been having right sided breast pain for 2 weeks. Pain extends from outer portion of her breast to her nipple. Reports sharp pain. Denies any redness, swelling, nipple discharge, temperature change, or mass noted. Advised she will need to be seen for further evaluation. Patient is agreeable and requests appointment for tomorrow. Appointment scheduled for 06/21/2017 at 4 pm with Dr.Miller. Patient is agreeable to date and time.  Routing to provider for final review. Patient agreeable to disposition. Will close encounter.

## 2017-06-21 ENCOUNTER — Ambulatory Visit (INDEPENDENT_AMBULATORY_CARE_PROVIDER_SITE_OTHER): Payer: PRIVATE HEALTH INSURANCE | Admitting: Obstetrics & Gynecology

## 2017-06-21 ENCOUNTER — Encounter: Payer: Self-pay | Admitting: Obstetrics & Gynecology

## 2017-06-21 VITALS — BP 108/60 | HR 84 | Resp 14 | Wt 172.0 lb

## 2017-06-21 DIAGNOSIS — N644 Mastodynia: Secondary | ICD-10-CM

## 2017-06-21 NOTE — Progress Notes (Signed)
GYNECOLOGY  VISIT   HPI: 46 y.o. G3P3 Married [place] American female here for complaint of right sided breast pain for two weeks.  Denies trauma, skin changes, or bruising.  Has no nipple discharge.  Does not feel breast size is any different.  Does not feel there is a mass.  Underwent a hysterectomy 2/15 so not sure if this is hormonally related.  Has never had anything like this in the past.  Denies vaginal bleeding, discharge, urinary or bowel changes.   She does drink mild amount of caffeine but not excessive amounts.  Last MMG 1/18.  Reviewed in [place].  GYNECOLOGIC HISTORY: Patient's last menstrual period was 11/21/2013 (exact date). Contraception: hysterectomy Menopausal hormone therapy: none  There are no active problems to display for this patient.   Past Medical History:  Diagnosis Date  . Abnormal Pap smear   . Anemia   . Anxiety   . GERD (gastroesophageal reflux disease)   . PONV (postoperative nausea and vomiting)     Past Surgical History:  Procedure Laterality Date  . CESAREAN SECTION  1993  . CYSTOSCOPY N/A 12/07/2013   Procedure: CYSTOSCOPY;  Surgeon: Lyman Speller, MD;  Location: [facility];  Service: Gynecology;  Laterality: N/A;  . ROBOTIC ASSISTED TOTAL HYSTERECTOMY N/A 12/07/2013   Procedure: ROBOTIC ASSISTED TOTAL HYSTERECTOMY with bilateral salpingectomy.;  Surgeon: Lyman Speller, MD;  Location: [facility];  Service: Gynecology;  Laterality: N/A;  . SALIVARY GLAND SURGERY     lump under chin removed    MEDS:   Current Outpatient Prescriptions on File Prior to Visit  Medication Sig Dispense Refill  . Multiple Vitamins-Minerals (MULTIVITAMIN PO) Take by mouth daily.     No current facility-administered medications on file prior to visit.     ALLERGIES: Patient has no known allergies.  Family History  Problem Relation Age of Onset  . Diabetes Father   . Alzheimer's disease Paternal Grandmother     SH:  Married, non  smoker  Review of Systems  Constitutional: Negative.   Respiratory: Negative.   Cardiovascular: Negative.   Psychiatric/Behavioral: Negative.     PHYSICAL EXAMINATION:    BP 108/60 (BP Location: Right Arm, Patient Position: Sitting, Cuff Size: Normal)   Pulse 84   Resp 14   Wt 172 lb (78 kg)   LMP 11/21/2013 (Exact Date)   BMI 29.07 kg/m     Physical Exam  Constitutional: She appears well-developed and well-nourished.  Neck: Normal range of motion. Neck supple. No thyromegaly present.  Cardiovascular: Normal rate and regular rhythm.   Respiratory: Effort normal and breath sounds normal. Right breast exhibits tenderness. Right breast exhibits no inverted nipple, no mass, no nipple discharge and no skin change. Left breast exhibits no inverted nipple, no mass, no nipple discharge, no skin change and no tenderness. Breasts are symmetrical.    Lymphadenopathy:    She has no cervical adenopathy.  Skin: Skin is warm and dry.  Psychiatric: She has a normal mood and affect.    Chaperone was present for exam.  Assessment: Breast pain  Plan: Pt is going to try heat and anti-inflammatories and give update in the early part of next week.  Vit E and decreased caffeine also discussed.   If persists through full four weeks (like timing for a cycle) will consider imaging (but this is UTD and reviewed in [place]).

## 2017-12-09 ENCOUNTER — Encounter: Payer: Self-pay | Admitting: Obstetrics & Gynecology

## 2019-04-20 ENCOUNTER — Encounter: Payer: Self-pay | Admitting: Obstetrics & Gynecology

## 2020-05-23 ENCOUNTER — Encounter: Payer: Self-pay | Admitting: Obstetrics & Gynecology

## 2021-07-03 ENCOUNTER — Other Ambulatory Visit: Payer: Self-pay

## 2021-07-03 ENCOUNTER — Encounter (HOSPITAL_BASED_OUTPATIENT_CLINIC_OR_DEPARTMENT_OTHER): Payer: Self-pay | Admitting: Obstetrics & Gynecology

## 2021-07-03 ENCOUNTER — Ambulatory Visit (INDEPENDENT_AMBULATORY_CARE_PROVIDER_SITE_OTHER): Payer: No Typology Code available for payment source | Admitting: Obstetrics & Gynecology

## 2021-07-03 VITALS — BP 135/86 | HR 77 | Ht 64.25 in | Wt 175.0 lb

## 2021-07-03 DIAGNOSIS — Z90710 Acquired absence of both cervix and uterus: Secondary | ICD-10-CM

## 2021-07-03 DIAGNOSIS — R1032 Left lower quadrant pain: Secondary | ICD-10-CM | POA: Diagnosis not present

## 2021-07-03 NOTE — Progress Notes (Signed)
GYNECOLOGY  VISIT  CC:   vaginal/pelvic pain  HPI: 50 y.o. G3P3 Married Judy Bishop here for new patient appointment due to intermittent shooting vaginal pain.  I have seen patient in the past but it has been several years.  This has been going on for a few weeks but over the weekend she started having cramping as well.  This felt like menstrual cramping.  Patient underwent a robotic assisted total laparoscopic hysterectomy with bilateral salpingectomy in February 2015.  I saw her for 1 postop visit and then about 10 months after surgery.  She is done well since that time.  She denies any itching, vaginal discharge or vaginal bleeding.  She denies changes in her bladder habits.  She does have chronic low back pain but that is unchanged.  She does not report any new GI issues.  She did have a colonoscopy in 2021 at [facility].  This was reviewed in care everywhere.  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 07/11/2016    Past Medical History:  Diagnosis Date   Abnormal Pap smear    Anemia    Anxiety    GERD (gastroesophageal reflux disease)    PONV (postoperative nausea and vomiting)     Past Surgical History:  Procedure Laterality Date   [place] N/A 12/07/2013   Procedure: CYSTOSCOPY;  Surgeon: Lyman Speller, MD;  Location: [facility];  Service: Gynecology;  Laterality: N/A;   ROBOTIC ASSISTED TOTAL HYSTERECTOMY N/A 12/07/2013   Procedure: ROBOTIC ASSISTED TOTAL HYSTERECTOMY with bilateral salpingectomy.;  Surgeon: Lyman Speller, MD;  Location: [facility];  Service: Gynecology;   Laterality: N/A;   SALIVARY GLAND SURGERY     lump under chin removed    MEDS:   Current Outpatient Medications on File Prior to Visit  Medication Sig Dispense Refill   Biotin 1 MG CAPS Take by mouth.     Cyanocobalamin 2500 MCG CHEW Chew by mouth.     Multiple Vitamins-Minerals (MULTIVITAMIN PO) Take by mouth daily.     omeprazole (PRILOSEC) 40 MG capsule Take 40 mg by mouth 2 (two) times daily.     No current facility-administered medications on file prior to visit.    ALLERGIES: Patient has no known allergies.  Family History  Problem Relation Age of Onset   Alzheimer's disease Paternal Grandmother    Diabetes Father    Other Sister        may-thurner's syndrome    SH: Married, non-smoker  Review of Systems  Constitutional: Negative.   Gastrointestinal: Negative.   Genitourinary:  Positive for pelvic pain. Negative for dysuria, flank pain, vaginal bleeding and vaginal discharge.   PHYSICAL EXAMINATION:    BP 135/86   Pulse 77   Ht 5' 4.25" (1.632 m)   Wt 175 lb (79.4 kg)   LMP 11/21/2013 (Exact Date)   BMI 29.81 kg/m     General appearance: alert, cooperative and appears stated age Abdomen: soft, mild left lower quadrant pain; bowel sounds normal; no masses,  no organomegaly Lymph:  no inguinal LAD noted  Pelvic: External genitalia:  no lesions  Urethra:  normal appearing urethra with no masses, tenderness or lesions              Bartholins and Skenes: normal                 Vagina: normal appearing vagina with normal color and discharge, no lesions              Cervix: absent              Bimanual Exam:  Uterus:  uterus absent              Adnexa: no mass, fullness, tenderness and nontender  Chaperone, Coral Ceo, CMA, was present for exam.  Assessment/Plan: 1. LLQ pain -Patient does not have any pelvic floor pain and exam is otherwise benign.  She has mild pain on the left side with deep palpation.  She has had a normal colonoscopy.  I have  recommended she start a probiotic, specifically Align.  Due to her standing during the day if this does not help, possibly pelvic PT could benefit her.  I have asked her to stay in touch with me over the next few weeks particularly if her symptoms worsen.  2. History of robot-assisted laparoscopic hysterectomy

## 2021-07-04 ENCOUNTER — Ambulatory Visit (HOSPITAL_BASED_OUTPATIENT_CLINIC_OR_DEPARTMENT_OTHER): Payer: PRIVATE HEALTH INSURANCE | Admitting: Obstetrics & Gynecology

## 2021-07-07 ENCOUNTER — Encounter (HOSPITAL_BASED_OUTPATIENT_CLINIC_OR_DEPARTMENT_OTHER): Payer: PRIVATE HEALTH INSURANCE | Admitting: Medical

## 2023-12-09 ENCOUNTER — Emergency Department (HOSPITAL_BASED_OUTPATIENT_CLINIC_OR_DEPARTMENT_OTHER)
Admission: EM | Admit: 2023-12-09 | Discharge: 2023-12-09 | Disposition: A | Discharge status: Home / Self Care | Payer: PRIVATE HEALTH INSURANCE | Attending: Emergency Medicine | Admitting: Emergency Medicine

## 2023-12-09 ENCOUNTER — Other Ambulatory Visit: Payer: Self-pay

## 2023-12-09 DIAGNOSIS — E860 Dehydration: Secondary | ICD-10-CM | POA: Insufficient documentation

## 2023-12-09 DIAGNOSIS — J101 Influenza due to other identified influenza virus with other respiratory manifestations: Secondary | ICD-10-CM | POA: Insufficient documentation

## 2023-12-09 DIAGNOSIS — R252 Cramp and spasm: Secondary | ICD-10-CM | POA: Diagnosis not present

## 2023-12-09 DIAGNOSIS — R519 Headache, unspecified: Secondary | ICD-10-CM | POA: Diagnosis present

## 2023-12-09 DIAGNOSIS — E876 Hypokalemia: Secondary | ICD-10-CM

## 2023-12-09 LAB — CBC WITH DIFFERENTIAL/PLATELET
Abs Immature Granulocytes: 0.03 10*3/uL (ref 0.00–0.07)
Basophils Absolute: 0 10*3/uL (ref 0.0–0.1)
Basophils Relative: 0 %
Eosinophils Absolute: 0 10*3/uL (ref 0.0–0.5)
Eosinophils Relative: 0 %
HCT: 34.1 % — ABNORMAL LOW (ref 36.0–46.0)
Hemoglobin: 11.4 g/dL — ABNORMAL LOW (ref 12.0–15.0)
Immature Granulocytes: 0 %
Lymphocytes Relative: 2 %
Lymphs Abs: 0.1 10*3/uL — ABNORMAL LOW (ref 0.7–4.0)
MCH: 28.9 pg (ref 26.0–34.0)
MCHC: 33.4 g/dL (ref 30.0–36.0)
MCV: 86.3 fL (ref 80.0–100.0)
Monocytes Absolute: 0.3 10*3/uL (ref 0.1–1.0)
Monocytes Relative: 4 %
Neutro Abs: 6.3 10*3/uL (ref 1.7–7.7)
Neutrophils Relative %: 94 %
Platelets: 211 10*3/uL (ref 150–400)
RBC: 3.95 MIL/uL (ref 3.87–5.11)
RDW: 12.8 % (ref 11.5–15.5)
WBC: 6.7 10*3/uL (ref 4.0–10.5)
nRBC: 0 % (ref 0.0–0.2)

## 2023-12-09 LAB — BASIC METABOLIC PANEL
Anion gap: 13 (ref 5–15)
BUN: 13 mg/dL (ref 6–20)
CO2: 22 mmol/L (ref 22–32)
Calcium: 9.1 mg/dL (ref 8.9–10.3)
Chloride: 100 mmol/L (ref 98–111)
Creatinine, Ser: 0.75 mg/dL (ref 0.44–1.00)
GFR, Estimated: >60 mL/min (ref >60)
Glucose, Bld: 120 mg/dL — ABNORMAL HIGH (ref 70–99)
Potassium: 3 mmol/L — ABNORMAL LOW (ref 3.5–5.1)
Sodium: 135 mmol/L (ref 135–145)

## 2023-12-09 LAB — RESP PANEL BY RT-PCR (RSV, FLU A&B, COVID)  RVPGX2
Influenza A by PCR: POSITIVE — AB
Influenza B by PCR: NEGATIVE
Resp Syncytial Virus by PCR: NEGATIVE
SARS Coronavirus 2 by RT PCR: NEGATIVE

## 2023-12-09 LAB — CK: Total CK: 103 U/L (ref 38–234)

## 2023-12-09 MED ORDER — POTASSIUM CHLORIDE CRYS ER 20 MEQ PO TBCR
40.0000 meq | EXTENDED_RELEASE_TABLET | Freq: Once | ORAL | Status: AC
  Administered 2023-12-09: 40 meq via ORAL
  Filled 2023-12-09: qty 2

## 2023-12-09 MED ORDER — ACETAMINOPHEN 500 MG PO TABS
1000.0000 mg | ORAL_TABLET | Freq: Four times a day (QID) | ORAL | Status: DC | PRN
  Administered 2023-12-09: 1000 mg via ORAL
  Filled 2023-12-09: qty 2

## 2023-12-09 MED ORDER — FENTANYL CITRATE PF 50 MCG/ML IJ SOSY
12.5000 ug | PREFILLED_SYRINGE | Freq: Once | INTRAMUSCULAR | Status: AC
  Administered 2023-12-09: 12.5 ug via INTRAVENOUS
  Filled 2023-12-09: qty 1

## 2023-12-09 MED ORDER — ONDANSETRON HCL 4 MG/2ML IJ SOLN
4.0000 mg | Freq: Once | INTRAMUSCULAR | Status: AC
  Administered 2023-12-09: 4 mg via INTRAVENOUS
  Filled 2023-12-09: qty 2

## 2023-12-09 MED ORDER — IBUPROFEN 400 MG PO TABS
600.0000 mg | ORAL_TABLET | Freq: Once | ORAL | Status: AC
  Administered 2023-12-09: 600 mg via ORAL
  Filled 2023-12-09: qty 1

## 2023-12-09 MED ORDER — ONDANSETRON 4 MG PO TBDP: 4.0000 mg | ORAL_TABLET | Freq: Three times a day (TID) | ORAL | 0 refills | Status: AC | PRN

## 2023-12-09 MED ORDER — POTASSIUM CHLORIDE CRYS ER 20 MEQ PO TBCR
20.0000 meq | EXTENDED_RELEASE_TABLET | Freq: Two times a day (BID) | ORAL | 0 refills | Status: AC
Start: 2023-12-09 — End: ?

## 2023-12-09 MED ORDER — METHOCARBAMOL 750 MG PO TABS: 750.0000 mg | ORAL_TABLET | Freq: Four times a day (QID) | ORAL | 0 refills | Status: AC

## 2023-12-09 MED ORDER — SODIUM CHLORIDE 0.9 % IV BOLUS
1000.0000 mL | Freq: Once | INTRAVENOUS | Status: AC
  Administered 2023-12-09: 1000 mL via INTRAVENOUS

## 2023-12-09 MED ORDER — METHOCARBAMOL 1000 MG/10ML IJ SOLN
750.0000 mg | Freq: Once | INTRAMUSCULAR | Status: AC
  Administered 2023-12-09: 750 mg via INTRAVENOUS
  Filled 2023-12-09: qty 10

## 2023-12-09 NOTE — Discharge Instructions (Addendum)
You were seen in the emergency department today for concerns of flulike symptoms.  You tested positive for influenza A.  You were given IV fluids, Zofran, and fentanyl.   I discharged home with a prescription for Zofran to continue taking at home to encourage adequate hydration. You will also benefit from supplementing your potassium for the next 4 days, and Robaxin (muscle relaxer) that seemed to give you some relief here.  If you have any concerns of any new or worsening symptoms such as evidence of dehydration, severe vomiting, or severe chest pain or shortness of breath, return the emergency department.  Otherwise please follow-up with your primary care provider for further evaluation.

## 2023-12-09 NOTE — ED Provider Notes (Signed)
  Physical Exam  BP (!) 156/94   Pulse (!) 115   Temp 98.4 F (36.9 C)   Resp 16   Ht 5\' 4"  (1.626 m)   Wt 65.8 kg   LMP 11/21/2013 (Exact Date)   SpO2 100%   BMI 24.89 kg/m   Physical Exam  Procedures  Procedures  ED Course / MDM    Medical Decision Making Amount and/or Complexity of Data Reviewed Labs: ordered.  Risk OTC drugs. Prescription drug management.   Patient seen by PA Zelaya and diagnosed with Flu A. On discharge, however, she reported severe muscular pain in LE's, back and neck. Because of the severity of her complaint, labs were ordered. IV Robaxin provided as well as ibuprofen 600 mg.   Pain improved. Labs c/w hypokalemia at 3.0. Supplementation prescribed. Robaxin also prescribed. Patient is comfortable with discharge home.        Elpidio Anis, PA-C 12/09/23 2020    Vanetta Mulders, MD 12/11/23 [phone]

## 2023-12-09 NOTE — ED Provider Notes (Signed)
[place] [facility] Provider Note   CSN: [id number] Arrival date & time: 12/09/23  1524     History Chief Complaint  Patient presents with   flu -like symptoms    Judy Bishop is a 53 y.o. female.  Patient past history significant for prediabetes, hyperlipidemia, generalized anxiety presents emergency department concerns of flulike illness.  States that she was seen by primary care provider office this morning and was tested for flu and was negative.  States that she is having some cramping in her legs as well as nausea, vomiting, and headaches.  States she has had a fever at home.  Denies any chest pain shortness of breath but does endorse a cough.  States that she has been unable to take most of any medications due to the nausea and vomiting and also reports poor hydration.  HPI     Home Medications Prior to Admission medications   Medication Sig Start Date End Date Taking? Authorizing Provider  ondansetron (ZOFRAN-ODT) 4 MG disintegrating tablet Take 1 tablet (4 mg total) by mouth every 8 (eight) hours as needed for nausea or vomiting. 12/09/23  Yes Smitty Knudsen, PA-C  Biotin 1 MG CAPS Take by mouth.    [provider]  Cyanocobalamin 2500 MCG CHEW Chew by mouth.    [provider]  Multiple Vitamins-Minerals (MULTIVITAMIN PO) Take by mouth daily.    [provider]  omeprazole (PRILOSEC) 40 MG capsule Take 40 mg by mouth 2 (two) times daily. 06/10/21   [provider]      Allergies    Patient has no known allergies.    Review of Systems   Review of Systems  Constitutional:  Positive for fever.  Gastrointestinal:  Positive for nausea.  All other systems reviewed and are negative.   Physical Exam Updated Vital Signs BP 134/89 (BP Location: Left Arm)   Pulse (!) 117   Temp 98.6 F (37 C)   Resp 18   Ht 5\' 4"  (1.626 m)   Wt 65.8 kg   LMP 11/21/2013 (Exact Date)   SpO2 100%   BMI 24.89 kg/m   Physical Exam Vitals and nursing note reviewed.  Constitutional:      General: She is not in acute distress.    Appearance: She is well-developed.  HENT:     Head: Normocephalic and atraumatic.  Eyes:     Conjunctiva/sclera: Conjunctivae normal.  Cardiovascular:     Rate and Rhythm: Normal rate and regular rhythm.     Heart sounds: No murmur heard. Pulmonary:     Effort: Pulmonary effort is normal. No respiratory distress.     Breath sounds: Normal breath sounds.  Abdominal:     Palpations: Abdomen is soft.     Tenderness: There is no abdominal tenderness.  Musculoskeletal:        General: No swelling.     Cervical back: Neck supple.  Skin:    General: Skin is warm and dry.     Capillary Refill: Capillary refill takes 2 to 3 seconds.     Comments: Delayed capillary refill indicating moderate dehydration.  Neurological:     Mental Status: She is alert.  Psychiatric:        Mood and Affect: Mood normal.     ED Results / Procedures / Treatments   Labs (all labs ordered are listed, but only abnormal results are displayed) Labs Reviewed  RESP PANEL BY RT-PCR (RSV, FLU A&B, COVID)  RVPGX2 - Abnormal; Notable for the following components:      Result Value   Influenza A by PCR POSITIVE (*)    All other components within normal limits    EKG None  Radiology No results found.  Procedures Procedures    Medications Ordered in ED Medications  sodium chloride 0.9 % bolus 1,000 mL (1,000 mLs Intravenous New Bag/Given 12/09/23 1802)  ondansetron (ZOFRAN) injection 4 mg (4 mg Intravenous Given 12/09/23 1807)  fentaNYL (SUBLIMAZE) injection 12.5 mcg (12.5 mcg Intravenous Given 12/09/23 1808)    ED Course/ Medical Decision Making/ A&P                                Medical Decision Making Risk Prescription drug management.   This patient presents to the ED for concern of flulike symptoms.  Differential diagnosis includes COVID-19, influenza, pneumonia,  bronchitis   Lab Tests:  I Ordered, and personally interpreted labs.  The pertinent results include: Respiratory panel positive for influenza A   Medicines ordered and prescription drug management:  I ordered medication including fluids, Zofran, fentanyl for dehydration, nausea, vomiting, pain Reevaluation of the patient after these medicines showed that the patient improved I have reviewed the patients home medicines and have made adjustments as needed   Problem List / ED Course:  Patient presents emergency department concerns of flulike illness.  States that her symptoms began late last night/early this morning.  Has had multiple episodes of vomiting.  Unable to tolerate p.o. due to the nausea.  Unsure of any sick contacts.  Also endorsing some cramping in her legs, headaches, subjective fevers. On exam, patient is otherwise well-appearing but uncomfortable.  Notably tachycardic.  No abnormal lung sounds.  Patient does have delayed capillary refill around 3 seconds or so.  Concern for some mild dehydration.  Will initiate a fluid bolus as well as IV Zofran.  A low-dose IV fentanyl also ordered for pain as patient is endorsing pain.  Patient is currently afebrile so no acute indication for antipyretic medications. Will reassess patient shortly. Patient started on fluids, IV zofran, and a small dose of fentanyl. Appears more comfortable. Patient otherwise stable at this time and would prefer to not start anti-viral treatment. Encouraged patient to return to the ER for any new or worsening symptoms, but otherwise advised close PCP follow up. Discharged home in stable condition.   Final Clinical Impression(s) / ED Diagnoses Final diagnoses:  Influenza A  Dehydration    Rx / DC Orders ED Discharge Orders          Ordered    ondansetron (ZOFRAN-ODT) 4 MG disintegrating tablet  Every 8 hours PRN        12/09/23 1817              Smitty Knudsen, PA-C 12/09/23 Elesa Massed, MD 12/11/23 [phone]

## 2023-12-09 NOTE — ED Triage Notes (Signed)
Pt reports that she was seen by PCP today and that she has been having some cramping in her legs, nausea, headache. Reports fever at home. Flu test at PCP was negative. Denies blood thinners. No shortness of breath but is having some cough. Also has headache.

## 2024-01-08 ENCOUNTER — Ambulatory Visit (INDEPENDENT_AMBULATORY_CARE_PROVIDER_SITE_OTHER): Payer: PRIVATE HEALTH INSURANCE | Admitting: Certified Nurse Midwife

## 2024-01-08 ENCOUNTER — Encounter (HOSPITAL_BASED_OUTPATIENT_CLINIC_OR_DEPARTMENT_OTHER): Payer: Self-pay | Admitting: Certified Nurse Midwife

## 2024-01-08 VITALS — BP 128/100 | HR 80 | Ht 64.0 in | Wt 144.6 lb

## 2024-01-08 DIAGNOSIS — Z780 Asymptomatic menopausal state: Secondary | ICD-10-CM | POA: Diagnosis not present

## 2024-01-08 DIAGNOSIS — Z01419 Encounter for gynecological examination (general) (routine) without abnormal findings: Secondary | ICD-10-CM | POA: Diagnosis not present

## 2024-01-08 NOTE — Progress Notes (Signed)
 53 y.o. G3P3 Married [place] American female here for annual exam. She has healthy children and grand-children. Works full time as OR Engineer, site. Pt feeling well emotionally. Having issues with right shoulder pain and limited ROM. She is sexually active with spouse. Denies any issues. No urinary incontenence.  Patient's last menstrual period was 11/21/2013 (exact date).          Sexually active: Yes.   Denies c/o. The current method of family planning is status post hysterectomy.    Exercising: Yes.     Smoker:  no  Health Maintenance: Pap:  Pap not indicated History of abnormal Pap:  no MMG:  UTD per pt ([facility]) Colonoscopy:  UTD per pt BMD:   n/a Screening Labs: PCP Dr. Creta Levin   reports that she has never smoked. She has never used smokeless tobacco. She reports that she does not currently use alcohol after a past usage of about 1.0 - 2.0 standard drink of alcohol per week. She reports that she does not use drugs.  Past Medical History:  Diagnosis Date   Abnormal Pap smear    Anemia    Anxiety    GERD (gastroesophageal reflux disease)    PONV (postoperative nausea and vomiting)     Past Surgical History:  Procedure Laterality Date   CESAREAN SECTION  1993   CYSTOSCOPY N/A 12/07/2013   Procedure: CYSTOSCOPY;  Surgeon: Annamaria Boots, MD;  Location: [facility];  Service: Gynecology;  Laterality: N/A;   ROBOTIC ASSISTED TOTAL HYSTERECTOMY N/A 12/07/2013   Procedure: ROBOTIC ASSISTED TOTAL HYSTERECTOMY with bilateral salpingectomy.;  Surgeon: Annamaria Boots, MD;  Location: [facility];  Service: Gynecology;  Laterality: N/A;   SALIVARY GLAND SURGERY     lump under chin removed    Current Outpatient Medications  Medication Sig Dispense Refill   omeprazole (PRILOSEC) 40 MG capsule Take 40 mg by mouth 2 (two) times daily.     WEGOVY 1.7 MG/0.75ML SOAJ      Biotin 1 MG CAPS Take by mouth. (Patient not taking: Reported on 01/08/2024)     Cyanocobalamin 2500 MCG  CHEW Chew by mouth. (Patient not taking: Reported on 01/08/2024)     methocarbamol (ROBAXIN-750) 750 MG tablet Take 1 tablet (750 mg total) by mouth 4 (four) times daily. (Patient not taking: Reported on 01/08/2024) 28 tablet 0   Multiple Vitamins-Minerals (MULTIVITAMIN PO) Take by mouth daily. (Patient not taking: Reported on 01/08/2024)     ondansetron (ZOFRAN-ODT) 4 MG disintegrating tablet Take 1 tablet (4 mg total) by mouth every 8 (eight) hours as needed for nausea or vomiting. (Patient not taking: Reported on 01/08/2024) 20 tablet 0   potassium chloride SA (KLOR-CON M) 20 MEQ tablet Take 1 tablet (20 mEq total) by mouth 2 (two) times daily. (Patient not taking: Reported on 01/08/2024) 8 tablet 0   No current facility-administered medications for this visit.    Family History  Problem Relation Age of Onset   Alzheimer's disease Paternal Grandmother    Diabetes Father    Other Sister        may-thurner's syndrome    ROS: Constitutional: negative, no hot flashes or night sweats Genitourinary:negative and no vaginal or urinary complaints, +constipation  Exam:   BP (!) 128/100 (BP Location: Right Arm, Patient Position: Sitting, Cuff Size: Normal)   Pulse 80   Ht 5\' 4"  (1.626 m)   Wt 144 lb 9.6 oz (65.6 kg)   LMP 11/21/2013 (Exact Date)   BMI 24.82  kg/m   Height: 5\' 4"  (162.6 cm)  General appearance: alert, cooperative and appears stated age Head: Normocephalic, without obvious abnormality, atraumatic Neck: no adenopathy, supple, symmetrical Breasts: normal appearance, no masses or tenderness, Inspection negative, No nipple retraction or dimpling, No nipple discharge or bleeding, No axillary or supraclavicular adenopathy, Normal to palpation without dominant masses Abdomen: soft, non-tender; bowel sounds normal; no masses,  no organomegaly Extremities: extremities normal, atraumatic, no cyanosis or edema Skin: Skin color, texture, turgor normal. No rashes or lesions Lymph nodes:  Cervical, supraclavicular, and axillary nodes normal. No abnormal inguinal nodes palpated Neurologic: Grossly normal   Pelvic: External genitalia:  no lesions              Urethra:  normal appearing urethra with no masses, tenderness or lesions              Bartholins and Skenes: normal                 Vagina: normal appearing vagina with normal color and no discharge, no lesions              Cervix: absent              Pap taken: No. Bimanual Exam:  Uterus:  uterus absent              Adnexa: no mass, fullness, tenderness              Anus:  normal sphincter tone, no lesions  Chaperone, Hendricks Milo, CMA, was present for exam.  Assessment/Plan:  1. Gynecologic exam normal - Pap smear not indicated (hysterectomy d/t benign disease) - Continue self breast awareness and annual screening mammograms - Pt has appt scheduled with GI - Consider establishing care with Dermatology for skin cancer screening - Pt UTD on Shingles Vaccines - She has upcoming appt with GI, Colonoscopy UTD (Hx. H. Pylori)  2. Postmenopausal (Primary) - FSH - Estradiol   RTO 1 year for annual gyn exam and prn if issues arise. Letta Kocher

## 2024-01-09 ENCOUNTER — Encounter (HOSPITAL_BASED_OUTPATIENT_CLINIC_OR_DEPARTMENT_OTHER): Payer: Self-pay | Admitting: Certified Nurse Midwife

## 2024-01-09 LAB — ESTRADIOL: Estradiol: 32.3 pg/mL

## 2024-01-09 LAB — FOLLICLE STIMULATING HORMONE: FSH: 34.7 m[IU]/mL
